# Patient Record
Sex: Female | Born: 1970
Health system: Southern US, Community
[De-identification: ages and names within clinical notes are randomized; demographics above are authoritative.]

## PROBLEM LIST (undated history)

## (undated) ENCOUNTER — Emergency Department (HOSPITAL_COMMUNITY): Admission: EM | Payer: Medicaid Other | Source: Home / Self Care

## (undated) DIAGNOSIS — L0291 Cutaneous abscess, unspecified: Secondary | ICD-10-CM

## (undated) HISTORY — PX: CHOLECYSTECTOMY: SHX55

## (undated) HISTORY — PX: APPENDECTOMY: SHX54

## (undated) HISTORY — PX: TONSILLECTOMY: SUR1361

---

## 1997-07-23 ENCOUNTER — Inpatient Hospital Stay (HOSPITAL_COMMUNITY): Admission: AD | Admit: 1997-07-23 | Discharge: 1997-07-23 | Payer: Self-pay | Admitting: Obstetrics & Gynecology

## 1997-10-03 ENCOUNTER — Ambulatory Visit (HOSPITAL_COMMUNITY): Admission: RE | Admit: 1997-10-03 | Discharge: 1997-10-03 | Payer: Self-pay | Admitting: Obstetrics

## 1997-11-01 ENCOUNTER — Ambulatory Visit (HOSPITAL_COMMUNITY): Admission: RE | Admit: 1997-11-01 | Discharge: 1997-11-01 | Payer: Self-pay | Admitting: Obstetrics

## 1997-12-29 ENCOUNTER — Inpatient Hospital Stay (HOSPITAL_COMMUNITY): Admission: AD | Admit: 1997-12-29 | Discharge: 1997-12-29 | Payer: Self-pay | Admitting: *Deleted

## 1998-01-02 ENCOUNTER — Encounter: Payer: Self-pay | Admitting: *Deleted

## 1998-01-02 ENCOUNTER — Inpatient Hospital Stay (HOSPITAL_COMMUNITY): Admission: AD | Admit: 1998-01-02 | Discharge: 1998-01-02 | Payer: Self-pay | Admitting: *Deleted

## 1998-01-03 ENCOUNTER — Ambulatory Visit (HOSPITAL_COMMUNITY): Admission: RE | Admit: 1998-01-03 | Discharge: 1998-01-03 | Payer: Self-pay | Admitting: *Deleted

## 1998-01-17 ENCOUNTER — Ambulatory Visit (HOSPITAL_COMMUNITY): Admission: RE | Admit: 1998-01-17 | Discharge: 1998-01-17 | Payer: Self-pay | Admitting: Obstetrics & Gynecology

## 1998-01-25 ENCOUNTER — Encounter: Admission: RE | Admit: 1998-01-25 | Discharge: 1998-01-25 | Payer: Self-pay | Admitting: Obstetrics

## 1998-01-25 ENCOUNTER — Ambulatory Visit (HOSPITAL_COMMUNITY): Admission: RE | Admit: 1998-01-25 | Discharge: 1998-01-25 | Payer: Self-pay | Admitting: *Deleted

## 1998-02-02 ENCOUNTER — Encounter (HOSPITAL_COMMUNITY): Admission: RE | Admit: 1998-02-02 | Discharge: 1998-03-21 | Payer: Self-pay | Admitting: *Deleted

## 1998-02-14 ENCOUNTER — Encounter: Admission: RE | Admit: 1998-02-14 | Discharge: 1998-02-14 | Payer: Self-pay | Admitting: Obstetrics & Gynecology

## 1998-02-20 ENCOUNTER — Ambulatory Visit (HOSPITAL_COMMUNITY): Admission: RE | Admit: 1998-02-20 | Discharge: 1998-02-20 | Payer: Self-pay | Admitting: Obstetrics

## 1998-02-21 ENCOUNTER — Encounter: Admission: RE | Admit: 1998-02-21 | Discharge: 1998-02-21 | Payer: Self-pay | Admitting: Obstetrics & Gynecology

## 1998-03-07 ENCOUNTER — Encounter: Admission: RE | Admit: 1998-03-07 | Discharge: 1998-03-07 | Payer: Self-pay | Admitting: Obstetrics & Gynecology

## 1998-03-13 ENCOUNTER — Encounter: Payer: Self-pay | Admitting: *Deleted

## 1998-03-13 ENCOUNTER — Inpatient Hospital Stay (HOSPITAL_COMMUNITY): Admission: AD | Admit: 1998-03-13 | Discharge: 1998-03-23 | Payer: Self-pay | Admitting: *Deleted

## 1998-03-20 ENCOUNTER — Encounter: Payer: Self-pay | Admitting: *Deleted

## 1998-03-30 ENCOUNTER — Inpatient Hospital Stay (HOSPITAL_COMMUNITY): Admission: AD | Admit: 1998-03-30 | Discharge: 1998-03-30 | Payer: Self-pay | Admitting: *Deleted

## 1998-04-08 ENCOUNTER — Inpatient Hospital Stay (HOSPITAL_COMMUNITY): Admission: AD | Admit: 1998-04-08 | Discharge: 1998-04-08 | Payer: Self-pay | Admitting: Obstetrics

## 1998-04-11 ENCOUNTER — Inpatient Hospital Stay (HOSPITAL_COMMUNITY): Admission: AD | Admit: 1998-04-11 | Discharge: 1998-04-11 | Payer: Self-pay | Admitting: Obstetrics

## 1999-10-20 ENCOUNTER — Emergency Department (HOSPITAL_COMMUNITY): Admission: EM | Admit: 1999-10-20 | Discharge: 1999-10-20 | Payer: Self-pay | Admitting: Emergency Medicine

## 1999-10-31 ENCOUNTER — Emergency Department (HOSPITAL_COMMUNITY): Admission: EM | Admit: 1999-10-31 | Discharge: 1999-10-31 | Payer: Self-pay | Admitting: Emergency Medicine

## 1999-12-24 ENCOUNTER — Emergency Department (HOSPITAL_COMMUNITY): Admission: EM | Admit: 1999-12-24 | Discharge: 1999-12-24 | Payer: Self-pay | Admitting: Emergency Medicine

## 2000-02-18 ENCOUNTER — Emergency Department (HOSPITAL_COMMUNITY): Admission: EM | Admit: 2000-02-18 | Discharge: 2000-02-18 | Payer: Self-pay | Admitting: Emergency Medicine

## 2000-02-21 ENCOUNTER — Encounter: Admission: RE | Admit: 2000-02-21 | Discharge: 2000-02-21 | Payer: Self-pay | Admitting: Hematology and Oncology

## 2000-02-24 ENCOUNTER — Encounter: Admission: RE | Admit: 2000-02-24 | Discharge: 2000-02-24 | Payer: Self-pay | Admitting: Internal Medicine

## 2001-06-17 ENCOUNTER — Emergency Department (HOSPITAL_COMMUNITY): Admission: EM | Admit: 2001-06-17 | Discharge: 2001-06-17 | Payer: Self-pay | Admitting: Emergency Medicine

## 2001-09-26 ENCOUNTER — Emergency Department (HOSPITAL_COMMUNITY): Admission: EM | Admit: 2001-09-26 | Discharge: 2001-09-26 | Payer: Self-pay | Admitting: Emergency Medicine

## 2001-12-13 ENCOUNTER — Emergency Department (HOSPITAL_COMMUNITY): Admission: EM | Admit: 2001-12-13 | Discharge: 2001-12-13 | Payer: Self-pay | Admitting: Emergency Medicine

## 2001-12-13 ENCOUNTER — Encounter: Payer: Self-pay | Admitting: Emergency Medicine

## 2001-12-25 ENCOUNTER — Inpatient Hospital Stay (HOSPITAL_COMMUNITY): Admission: AD | Admit: 2001-12-25 | Discharge: 2001-12-25 | Payer: Self-pay | Admitting: *Deleted

## 2002-01-26 ENCOUNTER — Emergency Department (HOSPITAL_COMMUNITY): Admission: EM | Admit: 2002-01-26 | Discharge: 2002-01-26 | Payer: Self-pay | Admitting: Emergency Medicine

## 2002-10-27 ENCOUNTER — Encounter: Payer: Self-pay | Admitting: Emergency Medicine

## 2002-10-27 ENCOUNTER — Emergency Department (HOSPITAL_COMMUNITY): Admission: EM | Admit: 2002-10-27 | Discharge: 2002-10-27 | Payer: Self-pay | Admitting: Emergency Medicine

## 2002-11-26 ENCOUNTER — Emergency Department (HOSPITAL_COMMUNITY): Admission: EM | Admit: 2002-11-26 | Discharge: 2002-11-26 | Payer: Self-pay | Admitting: Emergency Medicine

## 2003-04-01 ENCOUNTER — Emergency Department (HOSPITAL_COMMUNITY): Admission: EM | Admit: 2003-04-01 | Discharge: 2003-04-01 | Payer: Self-pay

## 2003-05-01 ENCOUNTER — Emergency Department (HOSPITAL_COMMUNITY): Admission: EM | Admit: 2003-05-01 | Discharge: 2003-05-01 | Payer: Self-pay | Admitting: Emergency Medicine

## 2003-05-06 ENCOUNTER — Emergency Department (HOSPITAL_COMMUNITY): Admission: EM | Admit: 2003-05-06 | Discharge: 2003-05-06 | Payer: Self-pay | Admitting: Family Medicine

## 2003-05-18 ENCOUNTER — Emergency Department (HOSPITAL_COMMUNITY): Admission: EM | Admit: 2003-05-18 | Discharge: 2003-05-18 | Payer: Self-pay | Admitting: Emergency Medicine

## 2003-05-18 ENCOUNTER — Emergency Department (HOSPITAL_COMMUNITY): Admission: EM | Admit: 2003-05-18 | Discharge: 2003-05-18 | Payer: Self-pay | Admitting: Family Medicine

## 2003-07-30 ENCOUNTER — Emergency Department (HOSPITAL_COMMUNITY): Admission: EM | Admit: 2003-07-30 | Discharge: 2003-07-30 | Payer: Self-pay | Admitting: Emergency Medicine

## 2003-10-04 ENCOUNTER — Emergency Department (HOSPITAL_COMMUNITY): Admission: EM | Admit: 2003-10-04 | Discharge: 2003-10-04 | Payer: Self-pay | Admitting: Emergency Medicine

## 2003-12-03 ENCOUNTER — Emergency Department (HOSPITAL_COMMUNITY): Admission: EM | Admit: 2003-12-03 | Discharge: 2003-12-03 | Payer: Self-pay | Admitting: Emergency Medicine

## 2003-12-25 ENCOUNTER — Emergency Department (HOSPITAL_COMMUNITY): Admission: EM | Admit: 2003-12-25 | Discharge: 2003-12-25 | Payer: Self-pay | Admitting: Emergency Medicine

## 2005-02-10 ENCOUNTER — Emergency Department (HOSPITAL_COMMUNITY): Admission: EM | Admit: 2005-02-10 | Discharge: 2005-02-10 | Payer: Self-pay | Admitting: Emergency Medicine

## 2005-06-29 ENCOUNTER — Emergency Department (HOSPITAL_COMMUNITY): Admission: EM | Admit: 2005-06-29 | Discharge: 2005-06-29 | Payer: Self-pay | Admitting: Emergency Medicine

## 2006-04-14 ENCOUNTER — Emergency Department (HOSPITAL_COMMUNITY): Admission: EM | Admit: 2006-04-14 | Discharge: 2006-04-15 | Payer: Self-pay | Admitting: Emergency Medicine

## 2006-04-14 ENCOUNTER — Emergency Department (HOSPITAL_COMMUNITY): Admission: EM | Admit: 2006-04-14 | Discharge: 2006-04-14 | Payer: Self-pay | Admitting: Family Medicine

## 2006-04-15 ENCOUNTER — Emergency Department (HOSPITAL_COMMUNITY): Admission: EM | Admit: 2006-04-15 | Discharge: 2006-04-15 | Payer: Self-pay | Admitting: Family Medicine

## 2006-04-15 ENCOUNTER — Inpatient Hospital Stay (HOSPITAL_COMMUNITY): Admission: EM | Admit: 2006-04-15 | Discharge: 2006-04-18 | Payer: Self-pay | Admitting: Emergency Medicine

## 2007-10-05 IMAGING — CT CT NECK W/ CM
3 series · 8 of 14 positions shown, 9 images · IV contrast (omnipaque)
Comparison: none

CLINICAL DATA: History of right neck swelling supraclavicular region.  Also fever.  
NECK CT WITH CONTRAST:
TECHNIQUE: Multidetector CT imaging of the neck was performed following the standard protocol during administration of intravenous contrast.
Contrast:  100 ml Omnipaque 300.

[Series 2: 2cc/(id)/1cc/(id) · axial · 0.47mm/px · z∈[-184,-105]mm · 2 of 65 slices shown, 3 images]
[im 22/65  soft-tissue]
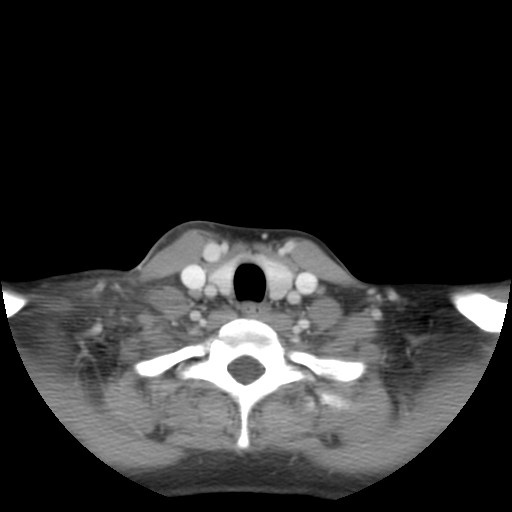
[im 22/65  bone]
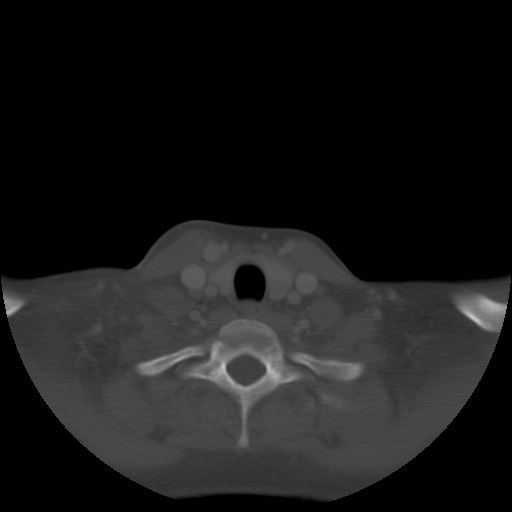
[im 43/65  bone]
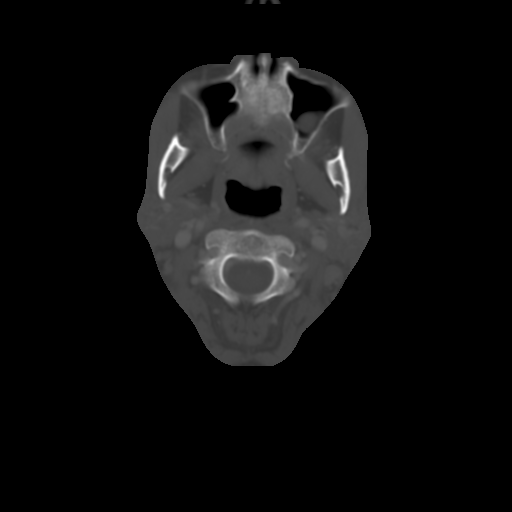

[Series 300: reformatted · sagittal · 0.48mm/px · 2 of 66 slices shown (1 of 2)]
[im 22/66  bone]
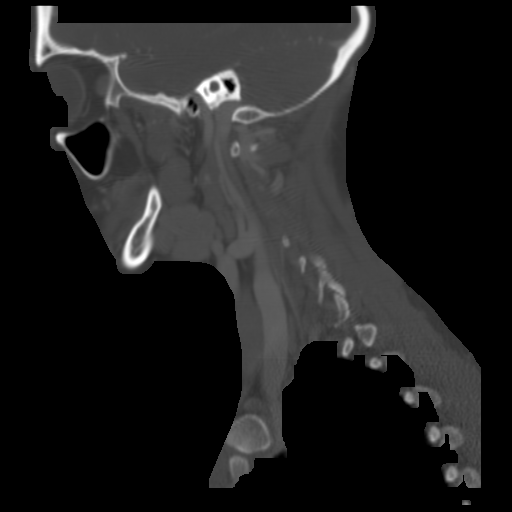
[im 44/66  bone]
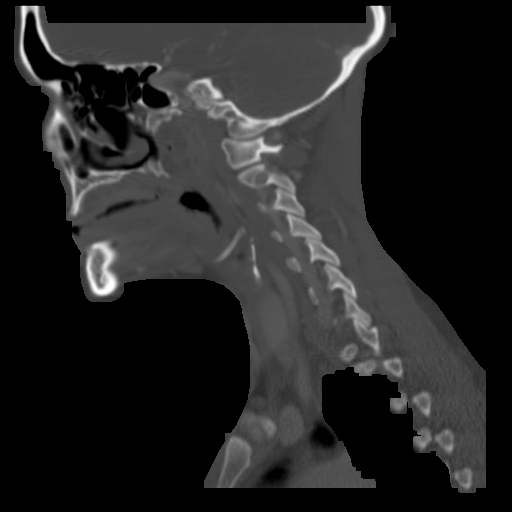

[Series 302: reformatted · coronal · 0.48mm/px · 4 of 92 slices shown (2 of 2)]
[im 19/92  bone]
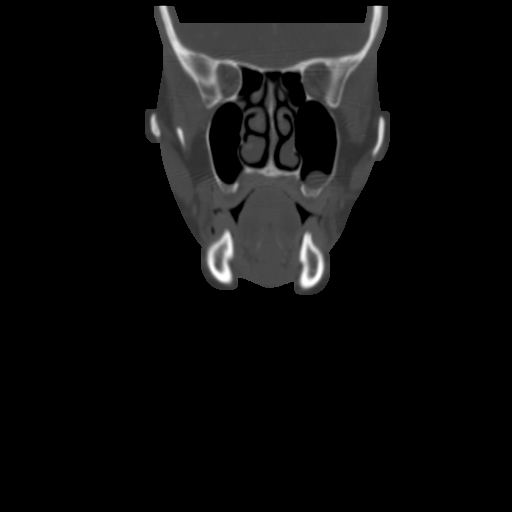
[im 37/92  bone]
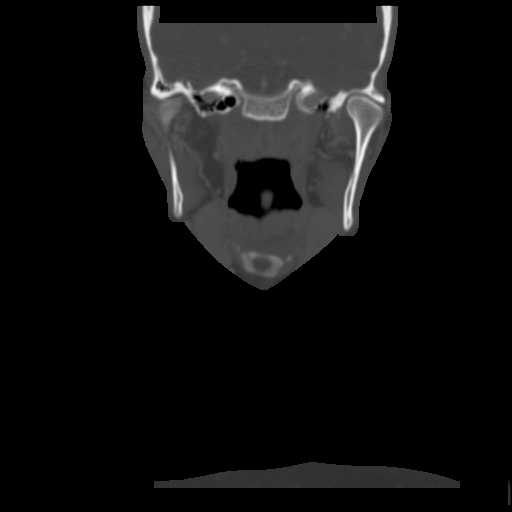
[im 55/92  bone]
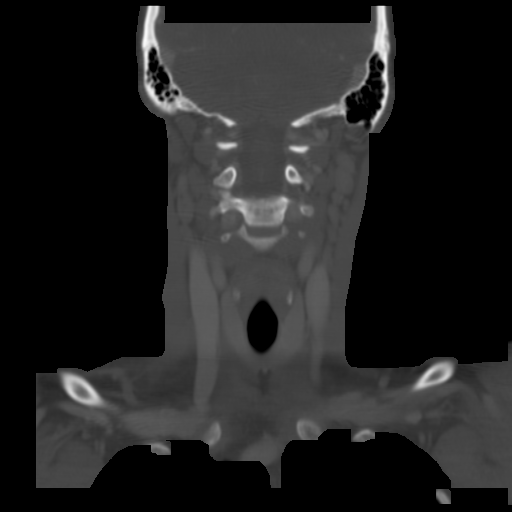
[im 73/92  bone]
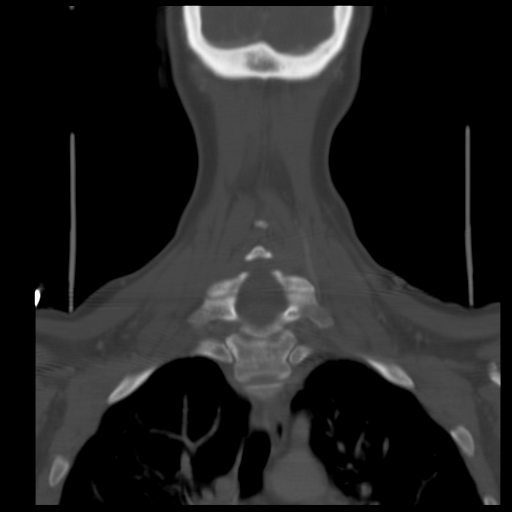

[8 of 14 positions shown; findings below may reference images not displayed]

FINDINGS: The visualized intracranial structures are unremarkable.  Paranasal sinuses are clear.  Airway widely patent.  No mucosal lesions identified.  Laryngeal structures unremarkable.  Thyroid gland and salivary glands normal.  
Major vascular structures widely patent.  Cervical spine unremarkable.  
No definite adenopathy is seen.  There is fullness in the right supraclavicular region with increased density of the normal hypoattenuating fat, suggesting inflammation.  There is no discrete mass, abscess, or area of coalescent lymph nodes.  The regional bones appear unremarkable.  There is no pulmonary process identified, and there is no mastoid pathology.
IMPRESSION: Ill-defined and relatively diffuse inflammatory change in the right supraclavicular fat, not associated with adenopathy, mastoid disease, pulmonary pathology, or abnormalities of the airway or esophagus.

## 2009-03-11 ENCOUNTER — Emergency Department (HOSPITAL_COMMUNITY): Admission: EM | Admit: 2009-03-11 | Discharge: 2009-03-11 | Payer: Self-pay | Admitting: Family Medicine

## 2010-01-09 ENCOUNTER — Ambulatory Visit (HOSPITAL_COMMUNITY): Admission: RE | Admit: 2010-01-09 | Payer: Self-pay | Source: Home / Self Care | Admitting: Family Medicine

## 2010-05-31 NOTE — Discharge Summary (Signed)
[name], [name] NO.:  [id number]   MEDICAL RECORD NO.:  [id number]          PATIENT TYPE:  INP   LOCATION:  5731                         FACILITY:  [facility]   PHYSICIAN:  Kinnie Scales. [name], M.D.DATE OF BIRTH:  [birth year]   DATE OF ADMISSION:  04/15/2006  DATE OF DISCHARGE:  04/18/2006                               DISCHARGE SUMMARY   ADMISSION DIAGNOSES:  1. Right cervical lymphadenopathy and cellulitis.  2. History of asthma.   DISCHARGE DIAGNOSES:  1. Right cervical lymphadenopathy and cellulitis.  2. History of asthma.   Patient was discharged to home in stable condition on April 18, 2006.   SURGERIES:  No surgeries this admission.   DIAGNOSTIC TESTING:  CT scan of the neck performed on April 15, 2006.  Patient is discharged to home in stable condition.  Normal diet, normal  activity.   MEDICATIONS:  Discharged on oral pain medication:  1. Percocet 5/325 1-2 p.o. q.4-6 hours p.r.n. pain.  2. Doxycycline 100 mg p.o. b.i.d. for 10 days.  3. Augmentin 500 mg p.o. b.i.d. for 10 days.   Patient is to scheduled to follow up at [facility] in approximately  2 weeks or sooner if warranted.   BRIEF HISTORY:  The patient is a 40 year old white female referred to  [facility] for evaluation of a progressive right cervical  lymphadenopathy, cellulitis and pain.  Patient developed symptoms of  acute upper respiratory tract infection approximately 2 weeks prior to  admission.  Has had a 2 day history of increasing right pain and  erythema with tenderness.  She was seen at the [facility] by Dr. Milus Glazier.  She was initially treated with  Rocephin and followed up the following day on April 15, 2006 with  increasing symptoms of neck pain and swelling.  Patient has past medical  history of asthma.  She is a one pack-per-day smoker.  On initial  evaluation was found to be afebrile with significant swelling and  tenderness in  the right neck.  Given her history and examination, I  recommended that we admit her to [facility] for intravenous  antibiotic therapy, a CT scan of the neck to rule out any abscess.  Patient understood and was transferred to [facility] for evaluation  and treatment.   HOSPITAL COURSE:  Patient was admitted in the ENT service on April 15, 2006.  She was started on intravenous vancomycin, on vancomycin  protocol.  She complained of severe neck pain. Initial blood work  included an elevated white blood cell count of 13.7 and low grade fever.  CT scan of the neck was obtained which showed cellulitic changes in the  soft tissue of the neck.  No evidence of significant adenopathy, mass,  tumor or cyst.  No abscess.  The patient was treated with intravenous  hydration, antibiotic therapy and pain management.  On the third  postoperative day, April 18, 2006, symptoms stabilized.  Patient was  afebrile.  She was tolerating normal oral diet without difficulty.  Pain  was well managed with  oral pain medication and her infection appeared to  be resolving. She was discharged home in stable condition. She had  normal bowel and bladder functions, ambulating well.  No problems or  complications.  She was discharged home with the above instructions and  scheduled to follow up at the [facility] in approximately 2 weeks  for further care or sooner as warranted.  The patient understood and  concurred with our discharge planning and will follow as above.           ______________________________  Kinnie Scales. [name], M.D.     DLS/MEDQ  D:  16/10/9602  T:  05/27/2006  Job:  [id number]   cc:   [facility]  Elvina Sidle, M.D.

## 2011-06-17 ENCOUNTER — Encounter (HOSPITAL_COMMUNITY): Payer: Self-pay | Admitting: Emergency Medicine

## 2011-06-17 ENCOUNTER — Emergency Department (HOSPITAL_COMMUNITY)
Admission: EM | Admit: 2011-06-17 | Discharge: 2011-06-17 | Disposition: A | Discharge status: Home / Self Care | Payer: Medicaid Other | Source: Home / Self Care | Attending: Emergency Medicine | Admitting: Emergency Medicine

## 2011-06-17 DIAGNOSIS — L0291 Cutaneous abscess, unspecified: Secondary | ICD-10-CM

## 2011-06-17 HISTORY — DX: Cutaneous abscess, unspecified: L02.91

## 2011-06-17 MED ORDER — ONDANSETRON 8 MG PO TBDP: 8.0000 mg | ORAL_TABLET | Freq: Three times a day (TID) | ORAL | Status: AC | PRN

## 2011-06-17 MED ORDER — OXYCODONE-ACETAMINOPHEN 5-325 MG PO TABS
ORAL_TABLET | ORAL | Status: AC
Start: 2011-06-17 — End: 2011-06-27

## 2011-06-17 MED ORDER — SULFAMETHOXAZOLE-TMP DS 800-160 MG PO TABS: 2.0000 | ORAL_TABLET | Freq: Two times a day (BID) | ORAL | Status: AC

## 2011-06-17 MED ORDER — MUPIROCIN 2 % EX OINT: TOPICAL_OINTMENT | Freq: Three times a day (TID) | CUTANEOUS | Status: AC

## 2011-06-17 NOTE — ED Notes (Signed)
Onset 2 days ago of pain on inside of leg, right leg.  Patient points to top of right inner thigh.

## 2011-06-17 NOTE — Discharge Instructions (Signed)

## 2011-06-17 NOTE — ED Provider Notes (Signed)
Chief Complaint  Patient presents with  . Leg Pain    History of Present Illness:   The patient is a 41 year old female who has had a 13 year history of recurring abscesses in her right groin area. The current abscess is been going on for about a day. It's not draining any pus. She denies any fever or chills. She has had abscesses elsewhere on her body. She does have a history of MRSA.  Review of Systems:  Other than noted above, the patient denies any of the following symptoms: Systemic:  No fever, chills or sweats. Skin:  No rash or itching.  PMFSH:  Past medical history, family history, social history, meds, and allergies were reviewed.  No history of diabetes or prior history of abscesses or MRSA.  Physical Exam:   Vital signs:  BP 122/76  Pulse 86  Temp(Src) 98.2 F (36.8 C) (Oral)  Resp 16  SpO2 96% Skin:  There is a 1 cm, fluctuant mass in the right groin, skin is otherwise normal.  No rash.  Procedure:  Verbal informed consent was obtained.  The patient was informed of the risks and benefits of the procedure and understands and accepts.  Identity of the patient was verified verbally and by wristband.   The abscess area described above was prepped with Betadine and alcohol and anesthetized with 2 mL of 2% Xylocaine with epinephrine.  Using a #11 scalpel blade, a singe straight incision was made into the area of fluctulence, yielding a small amount of prurulent drainage.  Routine cultures were obtained.  Blunt dissection was used to break up loculations and the resulting wound cavity was packed with 1/4 inch Iodoform gauze.  A sterile pressure dressing was applied.  Assessment:  The encounter diagnosis was Abscess.  Plan:   1.  The following meds were prescribed:   New Prescriptions   MUPIROCIN OINTMENT (BACTROBAN) 2 %    Apply topically 3 (three) times daily.   ONDANSETRON (ZOFRAN ODT) 8 MG DISINTEGRATING TABLET    Take 1 tablet (8 mg total) by mouth every 8 (eight) hours as  needed for nausea.   OXYCODONE-ACETAMINOPHEN (PERCOCET) 5-325 MG PER TABLET    1 to 2 tablets every 6 hours as needed for pain.   SULFAMETHOXAZOLE-TRIMETHOPRIM (BACTRIM DS) 800-160 MG PER TABLET    Take 2 tablets by mouth 2 (two) times daily.   2.  The patient was instructed in symptomatic care and handouts were given. 3.  The patient was instructed to leave the dressing in place and return again in 48 hours for packing removal.   Reuben Likes, MD 06/17/11 2216

## 2011-06-21 LAB — CULTURE, ROUTINE-ABSCESS

## 2011-06-22 NOTE — ED Notes (Signed)
Urine culture shows: MULTIPLE ORGANISMS PRESENT, NONE PREDOMINANT.  Pt adequately treated at visit with Bactrim and Bactroban.

## 2013-09-17 ENCOUNTER — Encounter (HOSPITAL_COMMUNITY): Payer: Self-pay | Admitting: Emergency Medicine

## 2013-09-17 ENCOUNTER — Emergency Department (HOSPITAL_COMMUNITY)
Admission: EM | Admit: 2013-09-17 | Discharge: 2013-09-17 | Disposition: A | Discharge status: Home / Self Care | Payer: Medicaid Other | Attending: Emergency Medicine | Admitting: Emergency Medicine

## 2013-09-17 DIAGNOSIS — Z8614 Personal history of Methicillin resistant Staphylococcus aureus infection: Secondary | ICD-10-CM | POA: Insufficient documentation

## 2013-09-17 DIAGNOSIS — L02415 Cutaneous abscess of right lower limb: Secondary | ICD-10-CM

## 2013-09-17 DIAGNOSIS — R110 Nausea: Secondary | ICD-10-CM | POA: Insufficient documentation

## 2013-09-17 DIAGNOSIS — F17200 Nicotine dependence, unspecified, uncomplicated: Secondary | ICD-10-CM | POA: Insufficient documentation

## 2013-09-17 DIAGNOSIS — J45909 Unspecified asthma, uncomplicated: Secondary | ICD-10-CM | POA: Insufficient documentation

## 2013-09-17 DIAGNOSIS — L03119 Cellulitis of unspecified part of limb: Principal | ICD-10-CM

## 2013-09-17 DIAGNOSIS — L02419 Cutaneous abscess of limb, unspecified: Secondary | ICD-10-CM | POA: Insufficient documentation

## 2013-09-17 MED ORDER — HYDROCODONE-ACETAMINOPHEN 5-325 MG PO TABS: 1.0000 | ORAL_TABLET | ORAL | Status: DC | PRN

## 2013-09-17 MED ORDER — SULFAMETHOXAZOLE-TRIMETHOPRIM 800-160 MG PO TABS: 1.0000 | ORAL_TABLET | Freq: Two times a day (BID) | ORAL | Status: DC

## 2013-09-17 MED ORDER — PROMETHAZINE HCL 25 MG PO TABS: 25.0000 mg | ORAL_TABLET | Freq: Four times a day (QID) | ORAL | Status: DC | PRN

## 2013-09-17 MED ORDER — CEPHALEXIN 500 MG PO CAPS
500.0000 mg | ORAL_CAPSULE | Freq: Four times a day (QID) | ORAL | Status: DC
Start: 2013-09-17 — End: 2014-02-08

## 2013-09-17 MED ORDER — ONDANSETRON 4 MG PO TBDP
8.0000 mg | ORAL_TABLET | Freq: Once | ORAL | Status: AC
  Administered 2013-09-17: 8 mg via ORAL
  Filled 2013-09-17: qty 2

## 2013-09-17 NOTE — ED Provider Notes (Signed)
CSN: [id number]     Arrival date & time 09/17/13  1924 History   None    This chart was scribed for non-physician practitioner, Dierdre Forth PA-C working with Layla Maw Ward, DO by Arlan Organ, ED Scribe. This patient was seen in room TR11C/TR11C and the patient's care was started at 9:16 PM.   Chief Complaint  Patient presents with  . Abcess    The history is provided by the patient. No language interpreter was used.    HPI Comments: [name] is a 43 y.o. female with a PMHx of asthma, recurrent boils and MRSA infection who presents to the Emergency Department complaining of an abscess to the R inner thigh x 2 days. Pt states area has progressively worsened since time of onset. She reports history of abscesses to same area previously. Last lancing treatment in 2011. She is unaware of a history of MRSA. No fever or chills. Pt with known allergy to Codeine. No other concerns this visit.   Past Medical History  Diagnosis Date  . Asthma   . Abscess    Past Surgical History  Procedure Laterality Date  . Appendectomy    . Cholecystectomy    . Tonsillectomy     History reviewed. No pertinent family history. History  Substance Use Topics  . Smoking status: Current Every Day Smoker  . Smokeless tobacco: Never Used  . Alcohol Use: No   OB History   Grav Para Term Preterm Abortions TAB SAB Ect Mult Living                 Review of Systems  Constitutional: Negative for fever and chills.  Gastrointestinal: Positive for nausea. Negative for vomiting.  Skin: Positive for rash and wound.  Allergic/Immunologic: Negative for immunocompromised state.  Hematological: Does not bruise/bleed easily.  Psychiatric/Behavioral: The patient is not nervous/anxious.       Allergies  Codeine  Home Medications   Prior to Admission medications   Medication Sig Start Date End Date Taking? Authorizing Provider  cephALEXin (KEFLEX) 500 MG capsule Take 1 capsule (500 mg total) by  mouth 4 (four) times daily. 09/17/13   Orlinda Slomski, PA-C  HYDROcodone-acetaminophen (NORCO/VICODIN) 5-325 MG per tablet Take 1 tablet by mouth every 4 (four) hours as needed for moderate pain or severe pain. 09/17/13   Breuna Loveall, PA-C  promethazine (PHENERGAN) 25 MG tablet Take 1 tablet (25 mg total) by mouth every 6 (six) hours as needed for nausea or vomiting. 09/17/13   Dahlia Client Aracelie Addis, PA-C  sulfamethoxazole-trimethoprim (SEPTRA DS) 800-160 MG per tablet Take 1 tablet by mouth every 12 (twelve) hours. 09/17/13   Aadvik Roker, PA-C   Triage Vitals: BP 149/72  Pulse 93  Temp(Src) 98.6 F (37 C) (Oral)  Resp 22  Ht  (1.549 m)  Wt 160 lb (72.576 kg)  BMI 30.25 kg/m2  SpO2 99%  LMP 08/15/2013   Physical Exam  Nursing note and vitals reviewed. Constitutional: She is oriented to person, place, and time. She appears well-developed and well-nourished. No distress.  HENT:  Head: Normocephalic and atraumatic.  Eyes: Conjunctivae are normal. No scleral icterus.  Neck: Normal range of motion.  Cardiovascular: Normal rate, regular rhythm, normal heart sounds and intact distal pulses.   No murmur heard. Pulmonary/Chest: Effort normal and breath sounds normal. No respiratory distress.  Abdominal: Soft. She exhibits no distension. There is no tenderness.  Lymphadenopathy:    She has no cervical adenopathy.  Neurological: She is alert and oriented  to person, place, and time.  Skin: Skin is warm and dry. She is not diaphoretic. There is erythema.  4x5cm area of erythema with 1x1cm area of induration  Psychiatric: She has a normal mood and affect.    ED Course  Procedures (including critical care time)  DIAGNOSTIC STUDIES: Oxygen Saturation is 99% on RA, Normal by my interpretation.    COORDINATION OF CARE: 9:16 PM- Will perform ultrasound and I&D. Discussed treatment plan with pt at bedside and pt agreed to plan.     EMERGENCY DEPARTMENT US SOFT TISSUE  INTERPRETATION "Study: Limited Ultrasound of the noted body part in comments below"  INDICATIONS: Pain Multiple views of the body part are obtained with a multi-frequency linear probe  PERFORMED BY:  Myself  IMAGES ARCHIVED?: Yes  SIDE:Right   BODY PART:Other soft tisse (comment in note) R inner thigh  FINDINGS: Small abcess present  LIMITATIONS:  Body Habitus  INTERPRETATION:  Abcess present and Cellulitis present  COMMENT:  Small abscess noted    INCISION AND DRAINAGE PROCEDURE NOTE: Patient identification was confirmed and verbal consent was obtained. This procedure was performed by Dahlia Client Katrina Daddona PA-C  at 9:16 PM. Site: R inner thigh Sterile procedures observed Anesthetic used (type and amt): 4 CC of 2% lidocaine with epinephrine  Drainage: Minimal Complexity: Complex Packing used: None Site anesthetized, incision made over site, wound drained and explored loculations, rinsed with copious amounts of normal saline, wound packed with sterile gauze, covered with dry, sterile dressing.  Pt tolerated procedure well without complications.  Instructions for care discussed verbally and pt provided with additional written instructions for homecare and f/u.   Labs Review Labs Reviewed - No data to display  Imaging Review No results found.   EKG Interpretation None      MDM   Final diagnoses:  Abscess of right leg excluding foot   [name] presents with abscess to the right inner thigh.  Hx of recurrent abscesses and MRSA infections.  Patient with skin abscess amenable to incision and drainage.  Abscess was not large enough to warrant packing or drain,  wound recheck in 2 days. Encouraged home warm soaks and flushing.  Mild signs of cellulitis is surrounding skin.  Will d/c to home.  Abx as pt has HX of MRSA.    BP 149/72  Pulse 93  Temp(Src) 98.6 F (37 C) (Oral)  Resp 22  Ht  (1.549 m)  Wt 160 lb (72.576 kg)  BMI 30.25 kg/m2  SpO2 99%  LMP  08/15/2013   I personally performed the services described in this documentation, which was scribed in my presence. The recorded information has been reviewed and is accurate.    Dahlia Client Laurelyn Terrero, PA-C 09/17/13 2119

## 2013-09-17 NOTE — ED Provider Notes (Signed)
Medical screening examination/treatment/procedure(s) were performed by non-physician practitioner and as supervising physician I was immediately available for consultation/collaboration.   EKG Interpretation None        Layla Maw Isabel Freese, DO 09/17/13 2316

## 2013-09-17 NOTE — Discharge Instructions (Signed)
1. Medications: vicodin, phenergan, keflex, bactrim, usual home medications 2. Treatment: rest, drink plenty of fluids, warm soaks 3. Follow Up: Please followup with your primary doctor for wound check in 2 days; if you do not have a primary care doctor use the resource guide provided to find one;      Abscess An abscess is an infected area that contains a collection of pus and debris.It can occur in almost any part of the body. An abscess is also known as a furuncle or boil. CAUSES  An abscess occurs when tissue gets infected. This can occur from blockage of oil or sweat glands, infection of hair follicles, or a minor injury to the skin. As the body tries to fight the infection, pus collects in the area and creates pressure under the skin. This pressure causes pain. People with weakened immune systems have difficulty fighting infections and get certain abscesses more often.  SYMPTOMS Usually an abscess develops on the skin and becomes a painful mass that is red, warm, and tender. If the abscess forms under the skin, you may feel a moveable soft area under the skin. Some abscesses break open (rupture) on their own, but most will continue to get worse without care. The infection can spread deeper into the body and eventually into the bloodstream, causing you to feel ill.  DIAGNOSIS  Your caregiver will take your medical history and perform a physical exam. A sample of fluid may also be taken from the abscess to determine what is causing your infection. TREATMENT  Your caregiver may prescribe antibiotic medicines to fight the infection. However, taking antibiotics alone usually does not cure an abscess. Your caregiver may need to make a small cut (incision) in the abscess to drain the pus. In some cases, gauze is packed into the abscess to reduce pain and to continue draining the area. HOME CARE INSTRUCTIONS   Only take over-the-counter or prescription medicines for pain, discomfort, or fever as  directed by your caregiver.  If you were prescribed antibiotics, take them as directed. Finish them even if you start to feel better.  If gauze is used, follow your caregiver's directions for changing the gauze.  To avoid spreading the infection:  Keep your draining abscess covered with a bandage.  Wash your hands well.  Do not share personal care items, towels, or whirlpools with others.  Avoid skin contact with others.  Keep your skin and clothes clean around the abscess.  Keep all follow-up appointments as directed by your caregiver. SEEK MEDICAL CARE IF:   You have increased pain, swelling, redness, fluid drainage, or bleeding.  You have muscle aches, chills, or a general ill feeling.  You have a fever. MAKE SURE YOU:   Understand these instructions.  Will watch your condition.  Will get help right away if you are not doing well or get worse. Document Released: 10/09/2004 Document Revised: 07/01/2011 Document Reviewed: 03/14/2011 [facility] Patient Information 2015 [place], [place]. This information is not intended to replace advice given to you by your health care provider. Make sure you discuss any questions you have with your health care provider.    Emergency Department Resource Guide 1) Find a Doctor and Pay Out of Pocket Although you won't have to find out who is covered by your insurance plan, it is a good idea to ask around and get recommendations. You will then need to call the office and see if the doctor you have chosen will accept you as a new patient and  what types of options they offer for patients who are self-pay. Some doctors offer discounts or will set up payment plans for their patients who do not have insurance, but you will need to ask so you aren't surprised when you get to your appointment.  2) Contact Your Local Health Department Not all health departments have doctors that can see patients for sick visits, but many do, so it is worth a call to see  if yours does. If you don't know where your local health department is, you can check in your phone book. The CDC also has a tool to help you locate your state's health department, and many state websites also have listings of all of their local health departments.  3) Find a Walk-in Clinic If your illness is not likely to be very severe or complicated, you may want to try a walk in clinic. These are popping up all over the country in pharmacies, drugstores, and shopping centers. They're usually staffed by nurse practitioners or physician assistants that have been trained to treat common illnesses and complaints. They're usually fairly quick and inexpensive. However, if you have serious medical issues or chronic medical problems, these are probably not your best option.  No Primary Care Doctor: - Call Health Connect at  [phone] - they can help you locate a primary care doctor that  accepts your insurance, provides certain services, etc. - Physician Referral Service- [phone]  Chronic Pain Problems: Organization         Address  Phone   Notes  [facility]  [phone] Patients need to be referred by their primary care doctor.   Medication Assistance: Organization         Address  Phone   Notes  [facility] Medication [facility] [address] [place], [place] [address] [phone] --Must be a resident of [facility] -- Must have NO insurance coverage whatsoever (no Medicaid/ Medicare, etc.) -- The pt. MUST have a primary care doctor that directs their care regularly and follows them in the community   [facility]  [phone]   [employer]  [phone]    Agencies that provide inexpensive medical care: Organization         Address  Phone   Notes  [facility]  [phone]   [facility]    [phone]   [facility] [address] [place], [place] [address] [phone]   [facility] [address] [place], [place] [phone]   Planned Parenthood    [phone]   [facility]    [phone]   [facility]  [address], [place] Phone:  [phone], Fax:  [phone] Hours of Operation:  9 am - 6 pm, M-F.  Also accepts Medicaid/Medicare and self-pay.  [facility]  [address], [place] Phone: [phone], Fax: [phone]. Hours of Operation:  8:30 am - 5:30 pm, M-F.  Also accepts Medicaid and self-pay.  [facility] [place] [address], [place] Phone: [phone]   [facility] [address] Natasha Bence [place], [place] [phone], Ext. [phone] Mondays & Thursdays: 7-9 AM.  First 15 patients are seen on a first come, first serve basis.    Medicaid-accepting [facility] Providers:  Retail buyer  Notes  [facility] [address], [place] [phone] Also accepts self-pay patients.  [facility] [address], [place]  [phone]   [place], [address], [place] [phone]   [facility] [address], [place] [phone]   Lucianne Lei [address], [place]   [phone] Only accepts [place] Access [place] patients after they have their name applied to their card.   Self-Pay (no insurance) in [facility]:  Organization         Address  Phone   Notes  Sickle Cell Patients, [facility] [place] [phone]   [facility] [place] [phone]   [facility] [place]  [place], [place], [place] [phone]   [facility]/Dr. Osei-Bonsu  [address] [place] or [address], [place] [phone] Phone number for both [place] and [place] locations is the same.  Urgent Medical and [facility] [address], [place] [phone]   [facility] [address] [place] or [address] [phone]   [facility] [address], [place] [phone], phone; [phone], fax Sees patients 1st and 3rd Saturday of every month.  Must not qualify for public or private insurance (i.e. Medicaid, Medicare, [place] Health Choice, Veterans' Benefits)  Household income should be no more than 200% of the poverty level The clinic cannot treat you if you are pregnant or think you are pregnant  Sexually transmitted diseases are not treated at the clinic.    Dental Care: Organization         Address  Phone  Notes  [facility] [place] [phone] Accepts children up to age 77 who are enrolled in [place] or [place]; pregnant women with a Medicaid card; and children who have applied for Medicaid or [place] Health Choice, but were declined, whose parents can pay a reduced fee at time of service.  [facility] Department of [facility]  [address], [place] [phone] Accepts children up to age 71 who are enrolled in [place] or [place]; pregnant women with a Medicaid card; and children who have applied for Medicaid or [place] Health Choice, but were declined, whose parents can pay a reduced fee at time of service.  [facility] Access PRO[facility]  [place] [phone] Patients are seen by appointment only. Walk-ins are not accepted. [place] will see patients 65 years of age and older. Monday - Tuesday (8am-5pm) Most Wednesdays (8:30-5pm) $30 per visit, cash only  [facility] Adult Dental Access PROGRAM  [address], [facility] [phone] Patients are  seen by appointment only. Walk-ins are not accepted. [place] will see patients 80 years of age and older. One Wednesday Evening (Monthly: Volunteer Based).  $30 per visit, cash only  [place]  [phone] for adults; Children under age 54, call Graduate Pediatric Dentistry at [phone]. Children aged 68-14, please call [phone] to request a pediatric application.  Dental services are provided in all areas of dental care including fillings, crowns and bridges, complete  and partial dentures, implants, gum treatment, root canals, and extractions. Preventive care is also provided. Treatment is provided to both adults and children. Patients are selected via a lottery and there is often a waiting list.   [facility] [address] [place]  [phone] [email or web address]   [facility] [address] [place], [place] [phone], Ext. [phone] Second and Fourth Thursday of each month, opens at 6:30 AM; Clinic ends at 9 AM.  Patients are seen on a first-come first-served basis, and a limited number are seen during each clinic.   [facility]  [address] Hillard Danker [place], [place] [phone]   Eligibility Requirements You must have lived in [place], [place], or [place] counties for at least the last three months.   You cannot be eligible for state or federal sponsored [employer], including [employer], [place], or [employer].   You generally cannot be eligible for healthcare insurance through your employer.    How to apply: Eligibility screenings are held every Tuesday and Wednesday afternoon from 1:00 pm until 4:00 pm. You do not need an appointment for the interview!  [facility] [address] [place], [place]   [place]  [place] Department  [place]  [phone]     Behavioral Health Resources in the Community: Intensive Outpatient Programs Organization         Address  Phone  Notes  [place]. [address] [place], [place] [phone]   [facility] Outpatient [address], [place], [place]   [facility] [address], [place], [place]   [place] [address]  [place], [place] or [phone]   Substance Abuse Resources Organization         Address  Phone  Notes  Alcohol [facility] Drug Services  [phone]   [place]  [phone]   [facility] [place]   Chinita Pester  [phone]   Residential & Outpatient Substance Abuse Program  [phone]   Psychological Services Organization         Address  Phone  Notes  [facility] [place]  [place]  [phone]   [place] [address], [place] or [phone]    Mobile Crisis Teams Organization         Address  Phone  Notes  [facility], Mobile Crisis Care Unit  [phone]   [facility]  [address] [place], [place]   [place] [address] [place] [phone]    Self-Help/Support Groups Organization         Address  Phone             Notes  [place]. of [place] - variety of support groups  [place] Call for more information  Narcotics Anonymous (NA), [facility] [address], [employer] [place]  2 meetings at this location   Special educational needs teacher         Address  Phone  Notes  [facility] [place],    [place]  [place]  [address] [place] [address]476, [place], [place]   [place], [place] [phone] Admissions: 8am-3pm M-F  Incentives  [facility]stance Abuse Treatment Center [address]    [place], [place] [phone]   [facility] [address] [place], [place], [place] [phone]   [facility] [address],  [place], [place] [phone]   [facility] - Intensive Outpatient [address] Laurell Joseph[address], [place], [place] [phone]   [facility] (Addiction Recovery Care [facility] [address] [place]  [place], [place] [phone] or [phone]   [facility] ([facility]) [address] [place], [place] [phone] Accepts Medicaid  [facility] [place] [address]  [place] [phone] Substance Abuse/Addiction Treatment   [facility] Organization         Address  Phone  Notes  [facility]  [phone]   Angie Fava, PhD [address] Ervin Knack [place], [place]   [phone] or [phone]   [facility] Behavioral   [address] [place], [place] [phone]   [facility] [address], [place], [place] [phone] Insurance/Medicaid/sponsorship through [facility] and Families [address]                                    [place], [place] [phone] Therapy/tele-psych/case  [facility] [address], [place] [phone]    Dr. Lolly Mustache  [phone]   [facility]  [employer] [facility] Dept. 1) [address], [place] 2) [address], [place] 3)  [address], [place] [phone]  [phone]   [facility] Child Abuse Hotline 985-643-2336 or [phone] (After Hours)

## 2013-09-17 NOTE — ED Notes (Signed)
Patient presents with an abcess to the right inner thigh.  States she has had others at the same place.

## 2014-02-08 ENCOUNTER — Emergency Department (HOSPITAL_COMMUNITY)
Admission: EM | Admit: 2014-02-08 | Discharge: 2014-02-08 | Disposition: A | Discharge status: Home / Self Care | Payer: Medicaid Other | Source: Home / Self Care | Attending: Family Medicine | Admitting: Family Medicine

## 2014-02-08 ENCOUNTER — Encounter (HOSPITAL_COMMUNITY): Payer: Self-pay | Admitting: Emergency Medicine

## 2014-02-08 DIAGNOSIS — J40 Bronchitis, not specified as acute or chronic: Secondary | ICD-10-CM

## 2014-02-08 MED ORDER — AZITHROMYCIN 250 MG PO TABS: 250.0000 mg | ORAL_TABLET | Freq: Every day | ORAL | Status: DC

## 2014-02-08 MED ORDER — ALBUTEROL SULFATE HFA 108 (90 BASE) MCG/ACT IN AERS: 2.0000 | INHALATION_SPRAY | Freq: Four times a day (QID) | RESPIRATORY_TRACT | Status: AC | PRN

## 2014-02-08 MED ORDER — IPRATROPIUM-ALBUTEROL 0.5-2.5 (3) MG/3ML IN SOLN
3.0000 mL | Freq: Once | RESPIRATORY_TRACT | Status: AC
  Administered 2014-02-08: 3 mL via RESPIRATORY_TRACT

## 2014-02-08 MED ORDER — PREDNISONE 50 MG PO TABS: 50.0000 mg | ORAL_TABLET | Freq: Every day | ORAL | Status: DC

## 2014-02-08 MED ORDER — IPRATROPIUM-ALBUTEROL 0.5-2.5 (3) MG/3ML IN SOLN
RESPIRATORY_TRACT | Status: AC
  Filled 2014-02-08: qty 3

## 2014-02-08 NOTE — ED Provider Notes (Addendum)
[name] is a 44 y.o. female who presents to Urgent Care today for cough wheezing and shortness of breath. Symptoms present for a few weeks and are consistent with previous episodes of bronchitis. Patient is a daily smoker and gets episodes of bronchitis several times per year. No vomiting diarrhea chest pain or severe shortness of breath. Patient wishes to quit smoking.   Past Medical History  Diagnosis Date  . Asthma   . Abscess    Past Surgical History  Procedure Laterality Date  . Appendectomy    . Cholecystectomy    . Tonsillectomy     History  Substance Use Topics  . Smoking status: Current Every Day Smoker  . Smokeless tobacco: Never Used  . Alcohol Use: No   ROS as above Medications: No current facility-administered medications for this encounter.   Current Outpatient Prescriptions  Medication Sig Dispense Refill  . albuterol (PROVENTIL HFA;VENTOLIN HFA) 108 (90 BASE) MCG/ACT inhaler Inhale 2 puffs into the lungs every 6 (six) hours as needed for wheezing or shortness of breath. 1 Inhaler 2  . azithromycin (ZITHROMAX) 250 MG tablet Take 1 tablet (250 mg total) by mouth daily. Take first 2 tablets together, then 1 every day until finished. 6 tablet 0  . predniSONE (DELTASONE) 50 MG tablet Take 1 tablet (50 mg total) by mouth daily. 5 tablet 0  . [DISCONTINUED] promethazine (PHENERGAN) 25 MG tablet Take 1 tablet (25 mg total) by mouth every 6 (six) hours as needed for nausea or vomiting. 12 tablet 0   Allergies  Allergen Reactions  . Codeine Itching and Nausea And Vomiting     Exam:  BP 149/93 mmHg  Pulse 92  Temp(Src) 98 F (36.7 C) (Oral)  Resp 16  SpO2 95% Gen: Well NAD HEENT: EOMI,  MMM normal tympanic membranes and posterior pharynx Lungs: Normal work of breathing. Slight wheezing bilaterally Heart: RRR no MRG Abd: NABS, Soft. Nondistended, Nontender Exts: Brisk capillary refill, warm and well perfused.   Patient was given a 2.5/0.5 mg DuoNeb  nebulizer treatment and felt better  No results found for this or any previous visit (from the past 24 hour(s)). No results found.  Assessment and Plan: 44 y.o. female with bronchitis possible COPD. Treat with albuterol prednisone and azithromycin. Return as needed. Encourage smoking cessation Discussed warning signs or symptoms. Please see discharge instructions. Patient expresses understanding.     Rodolph BongEvan S Yenty Bloch, MD 02/08/14 1317  Rodolph BongEvan S Obadiah Dennard, MD 02/08/14 [phone]

## 2014-02-08 NOTE — Discharge Instructions (Signed)
Thank you for coming in today. Call or go to the emergency room if you get worse, have trouble breathing, have chest pains, or palpitations.   Set a quit date. You can use 21 mg patches daily with 0.5 mg nicotine gum Please quit smoking  Acute Bronchitis Bronchitis is inflammation of the airways that extend from the windpipe into the lungs (bronchi). The inflammation often causes mucus to develop. This leads to a cough, which is the most common symptom of bronchitis.  In acute bronchitis, the condition usually develops suddenly and goes away over time, usually in a couple weeks. Smoking, allergies, and asthma can make bronchitis worse. Repeated episodes of bronchitis may cause further lung problems.  CAUSES Acute bronchitis is most often caused by the same virus that causes a cold. The virus can spread from person to person (contagious) through coughing, sneezing, and touching contaminated objects. SIGNS AND SYMPTOMS   Cough.   Fever.   Coughing up mucus.   Body aches.   Chest congestion.   Chills.   Shortness of breath.   Sore throat.  DIAGNOSIS  Acute bronchitis is usually diagnosed through a physical exam. Your health care provider will also ask you questions about your medical history. Tests, such as chest X-rays, are sometimes done to rule out other conditions.  TREATMENT  Acute bronchitis usually goes away in a couple weeks. Oftentimes, no medical treatment is necessary. Medicines are sometimes given for relief of fever or cough. Antibiotic medicines are usually not needed but may be prescribed in certain situations. In some cases, an inhaler may be recommended to help reduce shortness of breath and control the cough. A cool mist vaporizer may also be used to help thin bronchial secretions and make it easier to clear the chest.  HOME CARE INSTRUCTIONS  Get plenty of rest.   Drink enough fluids to keep your urine clear or pale yellow (unless you have a medical  condition that requires fluid restriction). Increasing fluids may help thin your respiratory secretions (sputum) and reduce chest congestion, and it will prevent dehydration.   Take medicines only as directed by your health care provider.  If you were prescribed an antibiotic medicine, finish it all even if you start to feel better.  Avoid smoking and secondhand smoke. Exposure to cigarette smoke or irritating chemicals will make bronchitis worse. If you are a smoker, consider using nicotine gum or skin patches to help control withdrawal symptoms. Quitting smoking will help your lungs heal faster.   Reduce the chances of another bout of acute bronchitis by washing your hands frequently, avoiding people with cold symptoms, and trying not to touch your hands to your mouth, nose, or eyes.   Keep all follow-up visits as directed by your health care provider.  SEEK MEDICAL CARE IF: Your symptoms do not improve after 1 week of treatment.  SEEK IMMEDIATE MEDICAL CARE IF:  You develop an increased fever or chills.   You have chest pain.   You have severe shortness of breath.  You have bloody sputum.   You develop dehydration.  You faint or repeatedly feel like you are going to pass out.  You develop repeated vomiting.  You develop a severe headache. MAKE SURE YOU:   Understand these instructions.  Will watch your condition.  Will get help right away if you are not doing well or get worse. Document Released: 02/07/2004 Document Revised: 05/16/2013 Document Reviewed: 06/22/2012 [facility] Patient Information 2015 [place]. This information is not intended to  replace advice given to you by your health care provider. Make sure you discuss any questions you have with your health care provider. ° °

## 2014-02-08 NOTE — ED Notes (Signed)
C/o cold sx onset 2 weeks Sx include: prod cough, congestion, chills, wheezing Smokes 1 PPD; hx of asthma Denies fevers Taking OTC cold meds w/no relief Alert, no signs of acute distress.

## 2014-07-31 ENCOUNTER — Emergency Department (HOSPITAL_COMMUNITY)
Admission: EM | Admit: 2014-07-31 | Discharge: 2014-08-01 | Disposition: A | Discharge status: Home / Self Care | Payer: Medicaid Other | Attending: Emergency Medicine | Admitting: Emergency Medicine

## 2014-07-31 ENCOUNTER — Encounter (HOSPITAL_COMMUNITY): Payer: Self-pay | Admitting: *Deleted

## 2014-07-31 DIAGNOSIS — Z79899 Other long term (current) drug therapy: Secondary | ICD-10-CM | POA: Diagnosis not present

## 2014-07-31 DIAGNOSIS — Z872 Personal history of diseases of the skin and subcutaneous tissue: Secondary | ICD-10-CM | POA: Diagnosis not present

## 2014-07-31 DIAGNOSIS — T63061A Toxic effect of venom of other North and South American snake, accidental (unintentional), initial encounter: Secondary | ICD-10-CM | POA: Diagnosis present

## 2014-07-31 DIAGNOSIS — T63004A Toxic effect of unspecified snake venom, undetermined, initial encounter: Secondary | ICD-10-CM

## 2014-07-31 DIAGNOSIS — Y999 Unspecified external cause status: Secondary | ICD-10-CM | POA: Insufficient documentation

## 2014-07-31 DIAGNOSIS — Y939 Activity, unspecified: Secondary | ICD-10-CM | POA: Insufficient documentation

## 2014-07-31 DIAGNOSIS — Z7952 Long term (current) use of systemic steroids: Secondary | ICD-10-CM | POA: Insufficient documentation

## 2014-07-31 DIAGNOSIS — Y9289 Other specified places as the place of occurrence of the external cause: Secondary | ICD-10-CM | POA: Diagnosis not present

## 2014-07-31 DIAGNOSIS — Z720 Tobacco use: Secondary | ICD-10-CM | POA: Diagnosis not present

## 2014-07-31 DIAGNOSIS — J45909 Unspecified asthma, uncomplicated: Secondary | ICD-10-CM | POA: Diagnosis not present

## 2014-07-31 LAB — CBC WITH DIFFERENTIAL/PLATELET
BASOS PCT: 1 % (ref 0–1)
Basophils Absolute: 0.1 10*3/uL (ref 0.0–0.1)
Eosinophils Absolute: 0.1 10*3/uL (ref 0.0–0.7)
Eosinophils Relative: 1 % (ref 0–5)
HEMATOCRIT: 34.7 % — AB (ref 36.0–46.0)
HEMOGLOBIN: 10.5 g/dL — AB (ref 12.0–15.0)
LYMPHS ABS: 3.5 10*3/uL (ref 0.7–4.0)
LYMPHS PCT: 28 % (ref 12–46)
MCH: 24.1 pg — ABNORMAL LOW (ref 26.0–34.0)
MCHC: 30.3 g/dL (ref 30.0–36.0)
MCV: 79.6 fL (ref 78.0–100.0)
Monocytes Absolute: 0.7 10*3/uL (ref 0.1–1.0)
Monocytes Relative: 6 % (ref 3–12)
NEUTROS PCT: 64 % (ref 43–77)
Neutro Abs: 7.8 10*3/uL — ABNORMAL HIGH (ref 1.7–7.7)
PLATELETS: 456 10*3/uL — AB (ref 150–400)
RBC: 4.36 MIL/uL (ref 3.87–5.11)
RDW: 16.7 % — ABNORMAL HIGH (ref 11.5–15.5)
WBC: 12.2 10*3/uL — ABNORMAL HIGH (ref 4.0–10.5)

## 2014-07-31 LAB — COMPREHENSIVE METABOLIC PANEL
ALBUMIN: 4.1 g/dL (ref 3.5–5.0)
ALT: 23 U/L (ref 14–54)
ANION GAP: 6 (ref 5–15)
AST: 20 U/L (ref 15–41)
Alkaline Phosphatase: 75 U/L (ref 38–126)
BILIRUBIN TOTAL: 0.5 mg/dL (ref 0.3–1.2)
BUN: 11 mg/dL (ref 6–20)
CALCIUM: 9 mg/dL (ref 8.9–10.3)
CHLORIDE: 104 mmol/L (ref 101–111)
CO2: 27 mmol/L (ref 22–32)
Creatinine, Ser: 0.9 mg/dL (ref 0.44–1.00)
GFR calc Af Amer: >60 mL/min (ref >60)
GLUCOSE: 114 mg/dL — AB (ref 65–99)
Potassium: 3.4 mmol/L — ABNORMAL LOW (ref 3.5–5.1)
Sodium: 137 mmol/L (ref 135–145)
Total Protein: 7.8 g/dL (ref 6.5–8.1)

## 2014-07-31 LAB — FIBRINOGEN: Fibrinogen: 362 mg/dL (ref 204–475)

## 2014-07-31 LAB — PROTIME-INR
INR: 1.04 (ref 0.00–1.49)
Prothrombin Time: 13.8 seconds (ref 11.6–15.2)

## 2014-07-31 MED ORDER — TETANUS-DIPHTH-ACELL PERTUSSIS 5-2.5-18.5 LF-MCG/0.5 IM SUSP
0.5000 mL | Freq: Once | INTRAMUSCULAR | Status: AC
  Administered 2014-07-31: 0.5 mL via INTRAMUSCULAR
  Filled 2014-07-31: qty 0.5

## 2014-07-31 MED ORDER — FENTANYL CITRATE (PF) 100 MCG/2ML IJ SOLN
50.0000 ug | Freq: Once | INTRAMUSCULAR | Status: AC
  Administered 2014-07-31: 50 ug via INTRAVENOUS
  Filled 2014-07-31: qty 2

## 2014-07-31 NOTE — ED Notes (Signed)
Per EMS, pt from home, pt reports copper head bite to R foot about an hour ago.  Swelling noted to R foot.  One puncture mark noted to medial R foot.

## 2014-07-31 NOTE — ED Provider Notes (Signed)
CSN: [id number]     Arrival date & time 07/31/14  2218 History   First MD Initiated Contact with Patient 07/31/14 2224     Chief Complaint  Patient presents with  . Snake Bite     (Consider location/radiation/quality/duration/timing/severity/associated sxs/prior Treatment) The history is provided by the patient and medical records.    This is a 44 year old female with history of asthma, presenting to the ED following a snake bite. [place] went outside towards her fire where her daughter and friend were making smores because the dog was barking uncontrollably.  Patient states she was wearing socks and blood drops when she felt a bite onto her right great toe. She states she went back into the house and got her husband, who went outside to try to find a snake. Patient elevated her foot and took extra strength Tylenol prior to calling EMS. Husband did locate a snake that was dead near the dog pen, unsure if this is the same snake that bit her. Snake that was brought in, does appear to be a copperhead.  Patient has pain localized to right great toe and some mild swelling of right dorsal foot has been marked by EMS. She was given fentanyl and route with significant improvement of her pain. She denies any dizziness, nausea, vomiting, or fever. Patient is not currently on any type of anticoagulation.  VSS.  Past Medical History  Diagnosis Date  . Asthma   . Abscess    Past Surgical History  Procedure Laterality Date  . Appendectomy    . Cholecystectomy    . Tonsillectomy     No family history on file. History  Substance Use Topics  . Smoking status: Current Every Day Smoker  . Smokeless tobacco: Never Used  . Alcohol Use: No   OB History    No data available     Review of Systems  Skin: Positive for wound.  All other systems reviewed and are negative.     Allergies  Codeine  Home Medications   Prior to Admission medications   Medication  Sig Start Date End Date Taking? Authorizing Provider  albuterol (PROVENTIL HFA;VENTOLIN HFA) 108 (90 BASE) MCG/ACT inhaler Inhale 2 puffs into the lungs every 6 (six) hours as needed for wheezing or shortness of breath. 02/08/14   [name], MD  azithromycin (ZITHROMAX) 250 MG tablet Take 1 tablet (250 mg total) by mouth daily. Take first 2 tablets together, then 1 every day until finished. 02/08/14   [name], MD  predniSONE (DELTASONE) 50 MG tablet Take 1 tablet (50 mg total) by mouth daily. 02/08/14   [name], MD   BP 156/76 mmHg  Pulse 67  Resp 16  SpO2 93%   Physical Exam  Constitutional: She is oriented to person, place, and time. She appears well-developed and well-nourished. No distress.  HENT:  Head: Normocephalic and atraumatic.  Mouth/Throat: Oropharynx is clear and moist.  Eyes: Conjunctivae and EOM are normal. Pupils are equal, round, and reactive to light.  Neck: Normal range of motion. Neck supple.  Cardiovascular: Normal rate, regular rhythm and normal heart sounds.   Pulmonary/Chest: Effort normal and breath sounds normal. No respiratory distress. She has no wheezes.  Abdominal: Soft. Bowel sounds are normal. There is no tenderness. There is no guarding.  Musculoskeletal: Normal range of motion.       Feet:  Solitary puncture wound to medial right great toe; bruising surrounding puncture wound  only; mild swelling noted of affected toe with extension to mid-dorsal foot but does not cross ankle joint; no significant erythema, induration, or streaking of foot/leg; strong DP pulse and cap refill; normal sensation throughout foot  Neurological: She is alert and oriented to person, place, and time.  Skin: Skin is warm and dry. She is not diaphoretic.  Psychiatric: She has a normal mood and affect.  Nursing note and vitals reviewed.       ED Course  Procedures (including critical care time) Labs Review Labs Reviewed  COMPREHENSIVE METABOLIC PANEL - Abnormal;  Notable for the following:    Potassium 3.4 (*)    Glucose, Bld 114 (*)    All other components within normal limits  CBC WITH DIFFERENTIAL/PLATELET - Abnormal; Notable for the following:    WBC 12.2 (*)    Hemoglobin 10.5 (*)    HCT 34.7 (*)    MCH 24.1 (*)    RDW 16.7 (*)    Platelets 456 (*)    Neutro Abs 7.8 (*)    All other components within normal limits  PROTIME-INR  FIBRINOGEN  CBC WITH DIFFERENTIAL/PLATELET  COMPREHENSIVE METABOLIC PANEL  PROTIME-INR  FIBRINOGEN    Imaging Review No results found.   EKG Interpretation None      MDM   Final diagnoses:  Snake bite, undetermined intent, initial encounter   44 year old female here with snake bite to right great toe. There was a dead copperhead found in dog pen, unsure if this is the snake that bit her. Patient is afebrile, nontoxic. She has a solitary puncture wound to medial right great toe with surrounding bruising. There is swelling from great toe extending into dorsal midfoot but does not extend past ankle.  There is no erythema, induration, or streaking up the foot or leg. Limb remains neurovascularly intact.  Lab work pending, tetanus will be updated. Foot elevated at this time.  10:32 PM Case discussed with poison control center-- remove anything on limb, no ice/compression, elevation only, draw line at site of swelling and monitor hourly.  Repeat coags in 4 hours, recommended monitoring for total of 6 hours.  If swelling progressing past 2 joint spaces (past mid-shin) consider anti-venom.  Call back if plan to administer crofab.  11:58 PM Patient reassessed.  No significant increase in swelling, still appears localized to right dorsal foot.  Swelling has not extended past ankle.  Remains without erythema, induration, or streaking.  Pain controlled with fentanyl for now.  Lab work reassuring.  Will continue to monitor.  12:58 AM Patient again reassessed.  Swelling may have extended slightly outside of prior  marking, still has not progressed past ankle.  Patient remains stable.  She was given dose of ativan as she has become very anxious, feels it is because she is not able to smoke.  She was offered nicotine patch multiple times which she declined.  Pain controlled at this time.  Care will be signed out to PA Piepenbrink-- will continue to monitor. She will follow-up on repeat labs which have been ordered to be drawn 4 hours after initial labs.  PRN dilaudid for pain.  If no complications noted and anti-venom not given, anticipate discharge after 6 hour observation.  Garlon HatchetLisa M Alysiana Ethridge, PA-C 08/01/14 0107  Alvira MondayErin Schlossman, MD 08/01/14 1345

## 2014-07-31 NOTE — ED Notes (Signed)
Bed: RESB Expected date:  Expected time:  Means of arrival:  Comments: EMS/copperhead bite

## 2014-08-01 LAB — COMPREHENSIVE METABOLIC PANEL
ALT: 22 U/L (ref 14–54)
ANION GAP: 5 (ref 5–15)
AST: 22 U/L (ref 15–41)
Albumin: 3.9 g/dL (ref 3.5–5.0)
Alkaline Phosphatase: 73 U/L (ref 38–126)
BUN: 11 mg/dL (ref 6–20)
CALCIUM: 8.6 mg/dL — AB (ref 8.9–10.3)
CO2: 26 mmol/L (ref 22–32)
CREATININE: 0.78 mg/dL (ref 0.44–1.00)
Chloride: 107 mmol/L (ref 101–111)
GFR calc non Af Amer: >60 mL/min (ref >60)
GLUCOSE: 160 mg/dL — AB (ref 65–99)
Potassium: 3.5 mmol/L (ref 3.5–5.1)
Sodium: 138 mmol/L (ref 135–145)
Total Bilirubin: 0.2 mg/dL — ABNORMAL LOW (ref 0.3–1.2)
Total Protein: 7.2 g/dL (ref 6.5–8.1)

## 2014-08-01 LAB — CBC WITH DIFFERENTIAL/PLATELET
BASOS ABS: 0.1 10*3/uL (ref 0.0–0.1)
BASOS PCT: 0 % (ref 0–1)
Eosinophils Absolute: 0.1 10*3/uL (ref 0.0–0.7)
Eosinophils Relative: 1 % (ref 0–5)
HCT: 34.2 % — ABNORMAL LOW (ref 36.0–46.0)
Hemoglobin: 10.3 g/dL — ABNORMAL LOW (ref 12.0–15.0)
LYMPHS PCT: 27 % (ref 12–46)
Lymphs Abs: 3.5 10*3/uL (ref 0.7–4.0)
MCH: 24.1 pg — ABNORMAL LOW (ref 26.0–34.0)
MCHC: 30.1 g/dL (ref 30.0–36.0)
MCV: 79.9 fL (ref 78.0–100.0)
MONO ABS: 0.9 10*3/uL (ref 0.1–1.0)
Monocytes Relative: 7 % (ref 3–12)
NEUTROS ABS: 8.7 10*3/uL — AB (ref 1.7–7.7)
NEUTROS PCT: 65 % (ref 43–77)
PLATELETS: 424 10*3/uL — AB (ref 150–400)
RBC: 4.28 MIL/uL (ref 3.87–5.11)
RDW: 16.7 % — ABNORMAL HIGH (ref 11.5–15.5)
WBC: 13.3 10*3/uL — AB (ref 4.0–10.5)

## 2014-08-01 LAB — PROTIME-INR
INR: 1.09 (ref 0.00–1.49)
PROTHROMBIN TIME: 14.3 s (ref 11.6–15.2)

## 2014-08-01 LAB — FIBRINOGEN: Fibrinogen: 373 mg/dL (ref 204–475)

## 2014-08-01 MED ORDER — TRAMADOL HCL 50 MG PO TABS: 50.0000 mg | ORAL_TABLET | Freq: Four times a day (QID) | ORAL | Status: DC | PRN

## 2014-08-01 MED ORDER — ONDANSETRON HCL 4 MG/2ML IJ SOLN
4.0000 mg | Freq: Once | INTRAMUSCULAR | Status: AC
  Administered 2014-08-01: 4 mg via INTRAVENOUS
  Filled 2014-08-01: qty 2

## 2014-08-01 MED ORDER — ONDANSETRON 4 MG PO TBDP: 4.0000 mg | ORAL_TABLET | Freq: Three times a day (TID) | ORAL | Status: DC | PRN

## 2014-08-01 MED ORDER — HYDROMORPHONE HCL 1 MG/ML IJ SOLN
1.0000 mg | INTRAMUSCULAR | Status: DC | PRN
  Administered 2014-08-01: 1 mg via INTRAVENOUS
  Filled 2014-08-01: qty 1

## 2014-08-01 MED ORDER — LORAZEPAM 2 MG/ML IJ SOLN
1.0000 mg | Freq: Once | INTRAMUSCULAR | Status: AC
  Administered 2014-08-01: 1 mg via INTRAVENOUS
  Filled 2014-08-01: qty 1

## 2014-08-01 NOTE — ED Notes (Addendum)
Patient has strong pedal pulse with cap refill less than 2 seconds. Pt able to move all toes and foot. Skin warm and dry. Sensation the same as right foot.

## 2014-08-01 NOTE — Discharge Instructions (Signed)
Please follow up with your primary care physician in 1-2 days. If you do not have one [facility] listed above. Please take pain medication and/or muscle relaxants as prescribed and as needed for pain. Please do not drive on narcotic pain medication or on muscle relaxants. Please read all discharge instructions and return precautions.   Snake Bite Snakes may be either venomous (containing poison) or nonvenomous (nonpoisonous). A nonvenomous snake bite will cause trauma or a wound to the skin and possibly the deeper tissues. A venomous snake will also cause a traumatic wound, but more importantly, it may have injected venom into the wound. Snake bite venom can be extremely serious and even deadly. One type of venom may cause major skin, tissue and muscle damage, and failure of normal blood clotting. This may cause extreme swelling and pain of the affected area. Another type of venom can affect the brain and nervous system and may cause death. The treatment for venomous snake bite may require the use of antivenom medicine. If you are unsure if your bite is from a venomous snake, you MUST seek immediate medical attention. YOU MIGHT NEED A TETANUS SHOT NOW IF:  You have no idea when you had the last one.  You have never had a tetanus shot before.  The bite broke your skin. If you need a tetanus shot, and you decide not to get one, there is a rare chance of getting tetanus. Sickness from tetanus can be serious. HOME CARE INSTRUCTIONS  A snake bit you and caused a skin wound. It may or may not have been venomous. If the snake was venomous, a small amount of venom may have been injected into your skin.  Keep the bite area clean and dry.  Keep the extremity elevated above the level of the heart for the next 48 hours.  Wash the bite area 3 times daily with soap and water or an antiseptic. Apply an adhesive or gauze bandage to the bite area.  If you develop  blistering of any type at the site of the bite, protect the blisters from breaking. Do not attempt to open it.  If you were given a tetanus shot, your arm may get swollen, red and warm at the shot site. This is a common response to the injection. SEEK IMMEDIATE MEDICAL CARE IF:   You develop symptoms of poisoning including increased pain, redness, swelling, blood blisters or purple spots in the bite area, nausea, vomiting, numbness, tingling, excessive sweating, breathing difficulty, blurred vision, feelings of lightheadedness, or feeling faint. If you develop symptoms of poisoning, you MUST seek immediate medical attention.  The bite becomes infected. Symptoms may include redness, swelling, pain, tenderness, pus, red streaks running from the wound, or an oral temperature above 102 F (38.9 C), not controlled by medicine.  Your condition or wound becomes worse. MAKE SURE YOU:   Understand these instructions.  Will watch your condition.  Will get help right away if you are not doing well or get worse. Document Released: 12/28/1999 Document Revised: 03/24/2011 Document Reviewed: 05/23/2009 [facility] Patient Information 2015 [employer] [place]. This information is not intended to replace advice given to you by your health care provider. Make sure you discuss any questions you have with your health care provider.

## 2014-08-01 NOTE — ED Notes (Addendum)
Spoke with poison control, provided updated information concerning about. Recommended if they swelling increases, progresses, then she may need anti-vitum.

## 2014-08-01 NOTE — ED Provider Notes (Signed)
Patient care acquired from Sharilyn Sites, PA-C.  Results for orders placed or performed during the hospital encounter of 07/31/14  Protime-INR  Result Value Ref Range   Prothrombin Time 13.8 11.6 - 15.2 seconds   INR 1.04 0.00 - 1.49  Fibrinogen  Result Value Ref Range   Fibrinogen 362 204 - 475 mg/dL  Comprehensive metabolic panel  Result Value Ref Range   Sodium 137 135 - 145 mmol/L   Potassium 3.4 (L) 3.5 - 5.1 mmol/L   Chloride 104 101 - 111 mmol/L   CO2 27 22 - 32 mmol/L   Glucose, Bld 114 (H) 65 - 99 mg/dL   BUN 11 6 - 20 mg/dL   Creatinine, Ser 3.08 0.44 - 1.00 mg/dL   Calcium 9.0 8.9 - 65.7 mg/dL   Total Protein 7.8 6.5 - 8.1 g/dL   Albumin 4.1 3.5 - 5.0 g/dL   AST 20 15 - 41 U/L   ALT 23 14 - 54 U/L   Alkaline Phosphatase 75 38 - 126 U/L   Total Bilirubin 0.5 0.3 - 1.2 mg/dL   GFR calc non Af Amer >60 >60 mL/min   GFR calc Af Amer >60 >60 mL/min   Anion gap 6 5 - 15  CBC with Differential  Result Value Ref Range   WBC 12.2 (H) 4.0 - 10.5 K/uL   RBC 4.36 3.87 - 5.11 MIL/uL   Hemoglobin 10.5 (L) 12.0 - 15.0 g/dL   HCT 84.6 (L) 96.2 - 95.2 %   MCV 79.6 78.0 - 100.0 fL   MCH 24.1 (L) 26.0 - 34.0 pg   MCHC 30.3 30.0 - 36.0 g/dL   RDW 84.1 (H) 32.4 - 40.1 %   Platelets 456 (H) 150 - 400 K/uL   Neutrophils Relative % 64 43 - 77 %   Neutro Abs 7.8 (H) 1.7 - 7.7 K/uL   Lymphocytes Relative 28 12 - 46 %   Lymphs Abs 3.5 0.7 - 4.0 K/uL   Monocytes Relative 6 3 - 12 %   Monocytes Absolute 0.7 0.1 - 1.0 K/uL   Eosinophils Relative 1 0 - 5 %   Eosinophils Absolute 0.1 0.0 - 0.7 K/uL   Basophils Relative 1 0 - 1 %   Basophils Absolute 0.1 0.0 - 0.1 K/uL  CBC with Differential  Result Value Ref Range   WBC 13.3 (H) 4.0 - 10.5 K/uL   RBC 4.28 3.87 - 5.11 MIL/uL   Hemoglobin 10.3 (L) 12.0 - 15.0 g/dL   HCT 02.7 (L) 25.3 - 66.4 %   MCV 79.9 78.0 - 100.0 fL   MCH 24.1 (L) 26.0 - 34.0 pg   MCHC 30.1 30.0 - 36.0 g/dL   RDW 40.3 (H) 47.4 - 25.9 %   Platelets 424 (H) 150 -  400 K/uL   Neutrophils Relative % 65 43 - 77 %   Neutro Abs 8.7 (H) 1.7 - 7.7 K/uL   Lymphocytes Relative 27 12 - 46 %   Lymphs Abs 3.5 0.7 - 4.0 K/uL   Monocytes Relative 7 3 - 12 %   Monocytes Absolute 0.9 0.1 - 1.0 K/uL   Eosinophils Relative 1 0 - 5 %   Eosinophils Absolute 0.1 0.0 - 0.7 K/uL   Basophils Relative 0 0 - 1 %   Basophils Absolute 0.1 0.0 - 0.1 K/uL  Comprehensive metabolic panel  Result Value Ref Range   Sodium 138 135 - 145 mmol/L   Potassium 3.5 3.5 - 5.1 mmol/L   Chloride  107 101 - 111 mmol/L   CO2 26 22 - 32 mmol/L   Glucose, Bld 160 (H) 65 - 99 mg/dL   BUN 11 6 - 20 mg/dL   Creatinine, Ser 1.610.78 0.44 - 1.00 mg/dL   Calcium 8.6 (L) 8.9 - 10.3 mg/dL   Total Protein 7.2 6.5 - 8.1 g/dL   Albumin 3.9 3.5 - 5.0 g/dL   AST 22 15 - 41 U/L   ALT 22 14 - 54 U/L   Alkaline Phosphatase 73 38 - 126 U/L   Total Bilirubin 0.2 (L) 0.3 - 1.2 mg/dL   GFR calc non Af Amer >60 >60 mL/min   GFR calc Af Amer >60 >60 mL/min   Anion gap 5 5 - 15  Protime-INR  Result Value Ref Range   Prothrombin Time 14.3 11.6 - 15.2 seconds   INR 1.09 0.00 - 1.49  Fibrinogen  Result Value Ref Range   Fibrinogen 373 204 - 475 mg/dL   No results found.  I have reviewed nursing notes, vital signs, and all lab results as noted above.  Neurovascularly intact. Normal sensation. No evidence of compartment syndrome. Swelling appears improved. PT/INR, Platelets and Fibronogen stable.  4:10 AM Discussed with poison control who feels patient is stable for DC.Discussed this with the patient who is upset that she is not receiving anti-venom. She is also demanding to go home with dilaudid.   Return precautions discussed. Patient is stable at time of discharge. Patient d/w with Dr. Norlene Campbelltter, agrees with plan.    Francee PiccoloJennifer Shea Swalley, PA-C 08/01/14 [id number]  Marisa Severinlga Otter, MD 08/01/14 [phone]

## 2015-04-14 ENCOUNTER — Encounter (HOSPITAL_COMMUNITY): Payer: Self-pay | Admitting: Physical Medicine and Rehabilitation

## 2015-04-14 ENCOUNTER — Emergency Department (HOSPITAL_COMMUNITY): Payer: Medicaid Other

## 2015-04-14 ENCOUNTER — Emergency Department (HOSPITAL_COMMUNITY)
Admission: EM | Admit: 2015-04-14 | Discharge: 2015-04-14 | Disposition: A | Discharge status: Home / Self Care | Payer: Medicaid Other | Attending: Emergency Medicine | Admitting: Emergency Medicine

## 2015-04-14 DIAGNOSIS — J069 Acute upper respiratory infection, unspecified: Secondary | ICD-10-CM | POA: Diagnosis not present

## 2015-04-14 DIAGNOSIS — R05 Cough: Secondary | ICD-10-CM | POA: Diagnosis present

## 2015-04-14 DIAGNOSIS — Z87891 Personal history of nicotine dependence: Secondary | ICD-10-CM | POA: Insufficient documentation

## 2015-04-14 DIAGNOSIS — Z79899 Other long term (current) drug therapy: Secondary | ICD-10-CM | POA: Diagnosis not present

## 2015-04-14 DIAGNOSIS — J45901 Unspecified asthma with (acute) exacerbation: Secondary | ICD-10-CM | POA: Insufficient documentation

## 2015-04-14 MED ORDER — IPRATROPIUM-ALBUTEROL 0.5-2.5 (3) MG/3ML IN SOLN
3.0000 mL | Freq: Once | RESPIRATORY_TRACT | Status: AC
  Administered 2015-04-14: 3 mL via RESPIRATORY_TRACT
  Filled 2015-04-14: qty 3

## 2015-04-14 MED ORDER — PREDNISONE 50 MG PO TABS: ORAL_TABLET | ORAL | Status: DC

## 2015-04-14 NOTE — ED Notes (Signed)
Pt reports generalized body aches, cough and fatigue. Ongoing x1 week.

## 2015-04-14 NOTE — ED Notes (Signed)
Declined W/C at D/C and was escorted to lobby by RN. 

## 2015-04-14 NOTE — ED Provider Notes (Signed)
CSN: [id number]     Arrival date & time 04/14/15  0904 History  By signing my name below, I, Rohini Rajnarayanan, attest that this documentation has been prepared under the direction and in the presence of Gaylyn RongSamantha Dowless PA-C Electronically Signed: Charlean Merlohini Rajnarayanan, ED Scribe 04/14/2015 at 9:46 AM.   Chief Complaint  Patient presents with  . Generalized Body Aches  . Cough   The history is provided by the patient. No language interpreter was used.    HPI Comments: [name] is a 45 y.o. female with a pmhx of asthma, who presents to the Emergency Department complaining of generalized body aches, chills, fatigue, productive cough, HA, and subjective fever x1 week. Pt's sx have been mostly constant with very slight improvement. Pt's husband has also been sick, but he has seen some relief in the past couple of days. Pt has taken Tylenol with little relief. Pt is staying hydrated, and is trying to uphold an adequate appetite. Pt denies any sore throat, ear pain, or emesis. Pt is currently trying to stop smoking.   Past Medical History  Diagnosis Date  . Asthma   . Abscess    Past Surgical History  Procedure Laterality Date  . Appendectomy    . Cholecystectomy    . Tonsillectomy     No family history on file. Social History  Substance Use Topics  . Smoking status: Former Games developermoker  . Smokeless tobacco: Never Used  . Alcohol Use: No   OB History    No data available     Review of Systems  All other systems reviewed and are negative.  10 Systems reviewed and all are negative for acute change except as noted in the HPI.  Allergies  Codeine  Home Medications   Prior to Admission medications   Medication Sig Start Date End Date Taking? Authorizing Provider  albuterol (PROVENTIL HFA;VENTOLIN HFA) 108 (90 BASE) MCG/ACT inhaler Inhale 2 puffs into the lungs every 6 (six) hours as needed for wheezing or shortness of breath. 02/08/14   Rodolph BongEvan S Corey, MD   diphenhydramine-acetaminophen (TYLENOL PM) 25-500 MG TABS Take 1 tablet by mouth at bedtime as needed (sleep/pain).    Historical Provider, MD  Multiple Vitamin (MULTIVITAMIN WITH MINERALS) TABS tablet Take 1 tablet by mouth daily.    Historical Provider, MD  omeprazole (PRILOSEC OTC) 20 MG tablet Take 20 mg by mouth daily as needed (heartburn).    Historical Provider, MD  ondansetron (ZOFRAN ODT) 4 MG disintegrating tablet Take 1 tablet (4 mg total) by mouth every 8 (eight) hours as needed for nausea. 08/01/14   [name], PA-C  traMADol (ULTRAM) 50 MG tablet Take 1 tablet (50 mg total) by mouth every 6 (six) hours as needed. 08/01/14   [name], PA-C   BP 129/71 mmHg  Pulse 83  Temp(Src) 99.3 F (37.4 C) (Oral)  Resp 18  Ht 5\' 1"  (1.549 m)  Wt 160 lb (72.576 kg)  BMI 30.25 kg/m2  SpO2 99% Physical Exam  Constitutional: She is oriented to person, place, and time. She appears well-developed and well-nourished. No distress.  HENT:  Head: Normocephalic and atraumatic.  Mouth/Throat: No oropharyngeal exudate.  TM clear b/l.  Eyes: Conjunctivae are normal. Right eye exhibits no discharge. Left eye exhibits no discharge. No scleral icterus.  Cardiovascular: Normal rate, regular rhythm, normal heart sounds and intact distal pulses.  Exam reveals no gallop and no friction rub.   No murmur heard. Pulmonary/Chest: Effort normal. She has wheezes (Expiratory  wheezes in b/l lower lung fields. ).  Neurological: She is alert and oriented to person, place, and time. Coordination normal.  Skin: Skin is warm and dry. No rash noted. She is not diaphoretic. No erythema. No pallor.  Psychiatric: She has a normal mood and affect. Her behavior is normal.  Nursing note and vitals reviewed.   ED Course  Procedures  DIAGNOSTIC STUDIES: Oxygen Saturation is 99% on RA, normal by my interpretation.    COORDINATION OF CARE: 9:37 AM-Discussed treatment plan which includes DG Chest,   with pt at bedside and pt agreed to plan.  Labs Review Labs Reviewed - No data to display  Imaging Review Dg Chest 2 View  04/14/2015  CLINICAL DATA:  Cough. EXAM: CHEST  2 VIEW COMPARISON:  April 01, 2003. FINDINGS: The heart size and mediastinal contours are within normal limits. Both lungs are clear. The visualized skeletal structures are unremarkable. IMPRESSION: No active cardiopulmonary disease. Electronically Signed   By: Lupita Raider, M.D.   On: 04/14/2015 10:34   I have personally reviewed and evaluated these images and lab results as part of my medical decision-making.   EKG Interpretation None      MDM   Final diagnoses:  Viral URI   45 y.o F presents with symptoms consistent with URI. Pt appears well in ED, non-toxic, non-septic. All VSS. Pt with mild expiratory wheezes on exam. Pt is a smoker and has hx of asthma. Duoneb given with significant symptomatic relief. Repeat lung exam is much improved. CXR negative for acute infiltrate. Pt will be discharged with symptomatic treatment.  Discussed return precautions.  Pt is hemodynamically stable & in NAD prior to discharge.   I personally performed the services described in this documentation, which was scribed in my presence. The recorded information has been reviewed and is accurate.       Lester Kinsman [place], PA-C 04/14/15 1132  Nelva Nay, MD 04/19/15 [phone]

## 2015-04-14 NOTE — Discharge Instructions (Signed)
Upper Respiratory Infection, Adult Most upper respiratory infections (URIs) are a viral infection of the air passages leading to the lungs. A URI affects the nose, throat, and upper air passages. The most common type of URI is nasopharyngitis and is typically referred to as "the common cold." URIs run their course and usually go away on their own. Most of the time, a URI does not require medical attention, but sometimes a bacterial infection in the upper airways can follow a viral infection. This is called a secondary infection. Sinus and middle ear infections are common types of secondary upper respiratory infections. Bacterial pneumonia can also complicate a URI. A URI can worsen asthma and chronic obstructive pulmonary disease (COPD). Sometimes, these complications can require emergency medical care and may be life threatening.  CAUSES Almost all URIs are caused by viruses. A virus is a type of germ and can spread from one person to another.  RISKS FACTORS You may be at risk for a URI if:   You smoke.   You have chronic heart or lung disease.  You have a weakened defense (immune) system.   You are very young or very old.   You have nasal allergies or asthma.  You work in crowded or poorly ventilated areas.  You work in health care facilities or schools. SIGNS AND SYMPTOMS  Symptoms typically develop 2-3 days after you come in contact with a cold virus. Most viral URIs last 7-10 days. However, viral URIs from the influenza virus (flu virus) can last 14-18 days and are typically more severe. Symptoms may include:   Runny or stuffy (congested) nose.   Sneezing.   Cough.   Sore throat.   Headache.   Fatigue.   Fever.   Loss of appetite.   Pain in your forehead, behind your eyes, and over your cheekbones (sinus pain).  Muscle aches.  DIAGNOSIS  Your health care provider may diagnose a URI by:  Physical exam.  Tests to check that your symptoms are not due to  another condition such as:  Strep throat.  Sinusitis.  Pneumonia.  Asthma. TREATMENT  A URI goes away on its own with time. It cannot be cured with medicines, but medicines may be prescribed or recommended to relieve symptoms. Medicines may help:  Reduce your fever.  Reduce your cough.  Relieve nasal congestion. HOME CARE INSTRUCTIONS   Take medicines only as directed by your health care provider.   Gargle warm saltwater or take cough drops to comfort your throat as directed by your health care provider.  Use a warm mist humidifier or inhale steam from a shower to increase air moisture. This may make it easier to breathe.  Drink enough fluid to keep your urine clear or pale yellow.   Eat soups and other clear broths and maintain good nutrition.   Rest as needed.   Return to work when your temperature has returned to normal or as your health care provider advises. You may need to stay home longer to avoid infecting others. You can also use a face mask and careful hand washing to prevent spread of the virus.  Increase the usage of your inhaler if you have asthma.   Do not use any tobacco products, including cigarettes, chewing tobacco, or electronic cigarettes. If you need help quitting, ask your health care provider. PREVENTION  The best way to protect yourself from getting a cold is to practice good hygiene.   Avoid oral or hand contact with people with cold  symptoms.   Wash your hands often if contact occurs.  There is no clear evidence that vitamin C, vitamin E, echinacea, or exercise reduces the chance of developing a cold. However, it is always recommended to get plenty of rest, exercise, and practice good nutrition.  SEEK MEDICAL CARE IF:   You are getting worse rather than better.   Your symptoms are not controlled by medicine.   You have chills.  You have worsening shortness of breath.  You have brown or red mucus.  You have yellow or brown nasal  discharge.  You have pain in your face, especially when you bend forward.  You have a fever.  You have swollen neck glands.  You have pain while swallowing.  You have white areas in the back of your throat. SEEK IMMEDIATE MEDICAL CARE IF:   You have severe or persistent:  Headache.  Ear pain.  Sinus pain.  Chest pain.  You have chronic lung disease and any of the following:  Wheezing.  Prolonged cough.  Coughing up blood.  A change in your usual mucus.  You have a stiff neck.  You have changes in your:  Vision.  Hearing.  Thinking.  Mood. MAKE SURE YOU:   Understand these instructions.  Will watch your condition.  Will get help right away if you are not doing well or get worse.   This information is not intended to replace advice given to you by your health care provider. Make sure you discuss any questions you have with your health care provider.  Take prednisone as prescribed. Continue alternating tylenol and ibuprofen avery 4-6 hours for fever. Encourage smoking cessation and adequate hydration, drink plenty of fluids. Return to the ED if you experience severe worsening of your symptoms, increased fever, difficulty breathing, vomiting, difficulty swallowing. Otherwise follow up with your primary care provider or [facility].

## 2015-05-16 ENCOUNTER — Encounter (HOSPITAL_COMMUNITY): Payer: Self-pay | Admitting: Emergency Medicine

## 2015-05-16 ENCOUNTER — Emergency Department (HOSPITAL_COMMUNITY)
Admission: EM | Admit: 2015-05-16 | Discharge: 2015-05-16 | Disposition: A | Discharge status: Home / Self Care | Payer: Medicaid Other | Attending: Emergency Medicine | Admitting: Emergency Medicine

## 2015-05-16 DIAGNOSIS — Z79899 Other long term (current) drug therapy: Secondary | ICD-10-CM | POA: Insufficient documentation

## 2015-05-16 DIAGNOSIS — J45909 Unspecified asthma, uncomplicated: Secondary | ICD-10-CM | POA: Diagnosis not present

## 2015-05-16 DIAGNOSIS — L0291 Cutaneous abscess, unspecified: Secondary | ICD-10-CM

## 2015-05-16 DIAGNOSIS — F17200 Nicotine dependence, unspecified, uncomplicated: Secondary | ICD-10-CM | POA: Diagnosis not present

## 2015-05-16 DIAGNOSIS — L02416 Cutaneous abscess of left lower limb: Secondary | ICD-10-CM | POA: Insufficient documentation

## 2015-05-16 MED ORDER — ACETAMINOPHEN 325 MG PO TABS
650.0000 mg | ORAL_TABLET | Freq: Once | ORAL | Status: AC
  Administered 2015-05-16: 650 mg via ORAL
  Filled 2015-05-16: qty 2

## 2015-05-16 MED ORDER — SULFAMETHOXAZOLE-TRIMETHOPRIM 800-160 MG PO TABS
1.0000 | ORAL_TABLET | Freq: Once | ORAL | Status: AC
  Administered 2015-05-16: 1 via ORAL
  Filled 2015-05-16: qty 1

## 2015-05-16 MED ORDER — SULFAMETHOXAZOLE-TRIMETHOPRIM 800-160 MG PO TABS: 1.0000 | ORAL_TABLET | Freq: Two times a day (BID) | ORAL | Status: AC

## 2015-05-16 MED ORDER — ACETAMINOPHEN 500 MG PO TABS: 500.0000 mg | ORAL_TABLET | Freq: Four times a day (QID) | ORAL | Status: AC | PRN

## 2015-05-16 MED ORDER — LIDOCAINE HCL 1 % IJ SOLN
20.0000 mL | Freq: Once | INTRAMUSCULAR | Status: DC
  Filled 2015-05-16: qty 20

## 2015-05-16 MED ORDER — LIDOCAINE HCL (PF) 1 % IJ SOLN
30.0000 mL | Freq: Once | INTRAMUSCULAR | Status: AC
  Administered 2015-05-16: 30 mL via INTRADERMAL
  Filled 2015-05-16: qty 30

## 2015-05-16 NOTE — ED Notes (Signed)
C/o abscess to L medial thigh x 2 weeks.

## 2015-05-16 NOTE — ED Notes (Signed)
See PA assessment 

## 2015-05-16 NOTE — ED Provider Notes (Signed)
CSN: [id number]     Arrival date & time 05/16/15  2051 History  By signing my name below, I, [name], attest that this documentation has been prepared under the direction and in the presence of [name], PA-C. Electronically Signed: Evon Reed, ED Scribe. 05/16/2015. 10:07 PM.    Chief Complaint  Patient presents with  . Abscess    The history is provided by the patient. No language interpreter was used.    HPI Comments: [name] is a 45 y.o. female who presents to the Emergency Department complaining of abscess to left medial thigh onset 1 week prior. Pt reports some associated drainage. Pt states she has tried epsom salt and draining the abscess with no relief. Pt denies fever, chills, nausea, or vomiting. Pt reports HX of abscesses.    Past Medical History  Diagnosis Date  . Asthma   . Abscess    Past Surgical History  Procedure Laterality Date  . Appendectomy    . Cholecystectomy    . Tonsillectomy     No family history on file. Social History  Substance Use Topics  . Smoking status: Current Every Day Smoker  . Smokeless tobacco: Never Used  . Alcohol Use: No   OB History    No data available      Review of Systems  Constitutional: Negative for fever and chills.  Gastrointestinal: Negative for nausea and vomiting.  Skin:       +abscess     Allergies  Codeine  Home Medications   Prior to Admission medications   Medication Sig Start Date End Date Taking? Authorizing Provider  acetaminophen (TYLENOL) 500 MG tablet Take 1 tablet (500 mg total) by mouth every 6 (six) hours as needed. 05/16/15   [name], PA-C  albuterol (PROVENTIL HFA;VENTOLIN HFA) 108 (90 BASE) MCG/ACT inhaler Inhale 2 puffs into the lungs every 6 (six) hours as needed for wheezing or shortness of breath. 02/08/14   [name], MD  diphenhydramine-acetaminophen (TYLENOL PM) 25-500 MG TABS Take 1 tablet by mouth at bedtime as needed (sleep/pain).     Historical Provider, MD  Multiple Vitamin (MULTIVITAMIN WITH MINERALS) TABS tablet Take 1 tablet by mouth daily.    Historical Provider, MD  omeprazole (PRILOSEC OTC) 20 MG tablet Take 20 mg by mouth daily as needed (heartburn).    Historical Provider, MD  ondansetron (ZOFRAN ODT) 4 MG disintegrating tablet Take 1 tablet (4 mg total) by mouth every 8 (eight) hours as needed for nausea. 08/01/14   [name], PA-C  predniSONE (DELTASONE) 50 MG tablet Take once daily for 5 days 04/14/15   [name], PA-C  sulfamethoxazole-trimethoprim (BACTRIM DS,SEPTRA DS) 800-160 MG tablet Take 1 tablet by mouth 2 (two) times daily. 05/16/15 05/23/15  [name], PA-C  traMADol (ULTRAM) 50 MG tablet Take 1 tablet (50 mg total) by mouth every 6 (six) hours as needed. 08/01/14   [name], PA-C    BP 148/77 mmHg  Pulse 76  Temp(Src) 97.8 F (36.6 C) (Oral)  Resp 20  Ht  (1.549 m)  Wt 72.576 kg  BMI 30.25 kg/m2  SpO2 96%  Physical Exam  Constitutional: She is oriented to person, place, and time. She appears well-developed and well-nourished. No distress.  HENT:  Head: Normocephalic and atraumatic.  Right Ear: External ear normal.  Left Ear: External ear normal.  Nose: Nose normal.  Eyes: Conjunctivae and EOM are normal. Right eye exhibits no discharge. Left eye  exhibits no discharge. No scleral icterus.  Neck: Normal range of motion. Neck supple.  Cardiovascular: Normal rate and regular rhythm.   Pulmonary/Chest: Effort normal and breath sounds normal. No respiratory distress.  Musculoskeletal: Normal range of motion. She exhibits no edema or tenderness.  Neurological: She is alert and oriented to person, place, and time.  Skin: Skin is warm and dry. She is not diaphoretic. There is erythema.  2 cm area of fluctuance to left medial proximal thigh with mild surrounding erythema.   Psychiatric: She has a normal mood and affect. Her behavior is normal.  Nursing  note and vitals reviewed.   ED Course  .Marland Kitchen.Incision and Drainage Date/Time: 05/16/2015 10:35 PM Performed by: [name], ELIZABETH C Authorized by: [name], ELIZABETH C Consent: Verbal consent obtained. Risks and benefits: risks, benefits and alternatives were discussed Consent given by: patient Patient understanding: patient states understanding of the procedure being performed Patient consent: the patient's understanding of the procedure matches consent given Procedure consent: procedure consent matches procedure scheduled Relevant documents: relevant documents present and verified Site marked: the operative site was marked Required items: required blood products, implants, devices, and special equipment available Patient identity confirmed: verbally with patient and anonymous protocol, patient vented/unresponsive Type: abscess Body area: lower extremity (left proximal medial thigh) Anesthesia: local infiltration Local anesthetic: lidocaine 1% without epinephrine Patient sedated: no Scalpel size: 11 Needle gauge: 22 Incision type: single straight Incision depth: dermal Complexity: complex Drainage: purulent Drainage amount: scant Wound treatment: wound left open Packing material: none Patient tolerance: Patient tolerated the procedure well with no immediate complications    DIAGNOSTIC STUDIES: Oxygen Saturation is 98% on RA, normal by my interpretation.    COORDINATION OF CARE: 10:07 PM-Discussed treatment plan with pt at bedside and pt agreed to plan.   Labs Review Labs Reviewed - No data to display  Imaging Review No results found.    EKG Interpretation None      MDM   Final diagnoses:  Abscess    45 year old female presents with abscess to her left inner thigh, which she states has been present for the past week. History of similar symptoms. Denies fever, chills, nausea, vomiting. Patient is afebrile. Vital signs stable. On exam, patient has a 2 cm area of  fluctuance to her left medial proximal thigh with mild surrounding erythema. Abscess drained in the ED, which the patient tolerated well. Given mild residual induration and erythema, will discharge with bactrim. Advised to use warm compresses to encourage continued drainage. Patient to follow-up in 2 days for wound recheck. Strict return precautions discussed. Patient verbalizes her understanding and is in agreement with plan.  BP 148/77 mmHg  Pulse 76  Temp(Src) 97.8 F (36.6 C) (Oral)  Resp 20  Ht 5\' 1"  (1.549 m)  Wt 72.576 kg  BMI 30.25 kg/m2  SpO2 96%  I personally performed the services described in this documentation, which was scribed in my presence. The recorded information has been reviewed and is accurate.       Mady Gemmalizabeth C Westfall, PA-C 05/16/15 2241  Arby BarretteMarcy Pfeiffer, MD 05/24/15 1231

## 2015-05-16 NOTE — Discharge Instructions (Signed)
1. Medications: bactrim, tylenol or ibuprofen for pain, usual home medications 2. Treatment: rest, drink plenty of fluids 3. Follow Up: please followup in 2 days for wound recheck; if you do not have a primary care doctor use the phone number listed in your discharge paperwork to find one; please return to the ER for increased pain, swelling, redness, new or worsening symptoms   Abscess An abscess (boil or furuncle) is an infected area on or under the skin. This area is filled with yellowish-white fluid (pus) and other material (debris). HOME CARE   Only take medicines as told by your doctor.  If you were given antibiotic medicine, take it as directed. Finish the medicine even if you start to feel better.  If gauze is used, follow your doctor's directions for changing the gauze.  To avoid spreading the infection:  Keep your abscess covered with a bandage.  Wash your hands well.  Do not share personal care items, towels, or whirlpools with others.  Avoid skin contact with others.  Keep your skin and clothes clean around the abscess.  Keep all doctor visits as told. GET HELP RIGHT AWAY IF:   You have more pain, puffiness (swelling), or redness in the wound site.  You have more fluid or blood coming from the wound site.  You have muscle aches, chills, or you feel sick.  You have a fever. MAKE SURE YOU:   Understand these instructions.  Will watch your condition.  Will get help right away if you are not doing well or get worse.   This information is not intended to replace advice given to you by your health care provider. Make sure you discuss any questions you have with your health care provider.   Document Released: 06/18/2007 Document Revised: 07/01/2011 Document Reviewed: 03/15/2011 Elsevier Interactive Patient Education [employer]

## 2016-08-18 ENCOUNTER — Encounter (HOSPITAL_COMMUNITY): Payer: Self-pay | Admitting: Emergency Medicine

## 2016-08-18 ENCOUNTER — Ambulatory Visit (HOSPITAL_COMMUNITY)
Admission: EM | Admit: 2016-08-18 | Discharge: 2016-08-18 | Disposition: A | Discharge status: Home / Self Care | Payer: Medicaid Other | Attending: Internal Medicine | Admitting: Internal Medicine

## 2016-08-18 DIAGNOSIS — M542 Cervicalgia: Secondary | ICD-10-CM

## 2016-08-18 DIAGNOSIS — M5489 Other dorsalgia: Secondary | ICD-10-CM

## 2016-08-18 DIAGNOSIS — T148XXA Other injury of unspecified body region, initial encounter: Secondary | ICD-10-CM

## 2016-08-18 MED ORDER — NAPROXEN 500 MG PO TABS: 500.0000 mg | ORAL_TABLET | Freq: Two times a day (BID) | ORAL | 0 refills | Status: DC

## 2016-08-18 MED ORDER — CYCLOBENZAPRINE HCL 10 MG PO TABS: 5.0000 mg | ORAL_TABLET | Freq: Two times a day (BID) | ORAL | 0 refills | Status: AC | PRN
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
## 2016-08-18 NOTE — ED Notes (Signed)
No answer
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
## 2016-08-18 NOTE — Discharge Instructions (Signed)
Your exam showed muscle strains of your neck and back. You can experience increased muscle soreness the next 24-48 hours. This is normal. Start NSAID as directed for pain and inflammation. Muscle relaxant as needed. Ice/heat compresses. This can take up to 2-3 weeks to resolve, but should be getting better each week. Monitor for worsening of symptoms, headache, sensitivity to light, nausea, vomiting, numbness, tingling, loss of bladder or bowel control, go to the ED for further evaluation.

## 2016-08-20 ENCOUNTER — Ambulatory Visit (HOSPITAL_COMMUNITY)
Admission: EM | Admit: 2016-08-20 | Discharge: 2016-08-20 | Disposition: A | Discharge status: Home / Self Care | Payer: Medicaid Other | Attending: Internal Medicine | Admitting: Internal Medicine

## 2016-08-20 ENCOUNTER — Encounter (HOSPITAL_COMMUNITY): Payer: Self-pay | Admitting: *Deleted

## 2016-08-20 DIAGNOSIS — T148XXA Other injury of unspecified body region, initial encounter: Secondary | ICD-10-CM

## 2016-08-20 DIAGNOSIS — M542 Cervicalgia: Secondary | ICD-10-CM

## 2016-08-20 DIAGNOSIS — T148XXD Other injury of unspecified body region, subsequent encounter: Secondary | ICD-10-CM

## 2016-08-20 DIAGNOSIS — M5489 Other dorsalgia: Secondary | ICD-10-CM

## 2016-08-20 MED ORDER — MELOXICAM 15 MG PO TABS: 15.0000 mg | ORAL_TABLET | Freq: Every day | ORAL | 0 refills | Status: AC
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
## 2016-08-20 NOTE — Discharge Instructions (Signed)
Use the Salon Pas menthol patch to the left side of your neck. May also apply gentle heat such as a heating pad or from a care wrap to the area. Return instructions and gradually start performing gentle stretches. Can also apply heat to the thigh muscles and lower left back. Take the Mobic for pain and stop taking the Naprosyn since that upsets her stomach.
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
## 2016-10-03 IMAGING — DX DG CHEST 2V
2 series · 2 of 2 positions shown · non-contrast
Comparison: April 01, 2003.

CLINICAL DATA: Cough.

EXAM:
CHEST  2 VIEW

[chest pa]
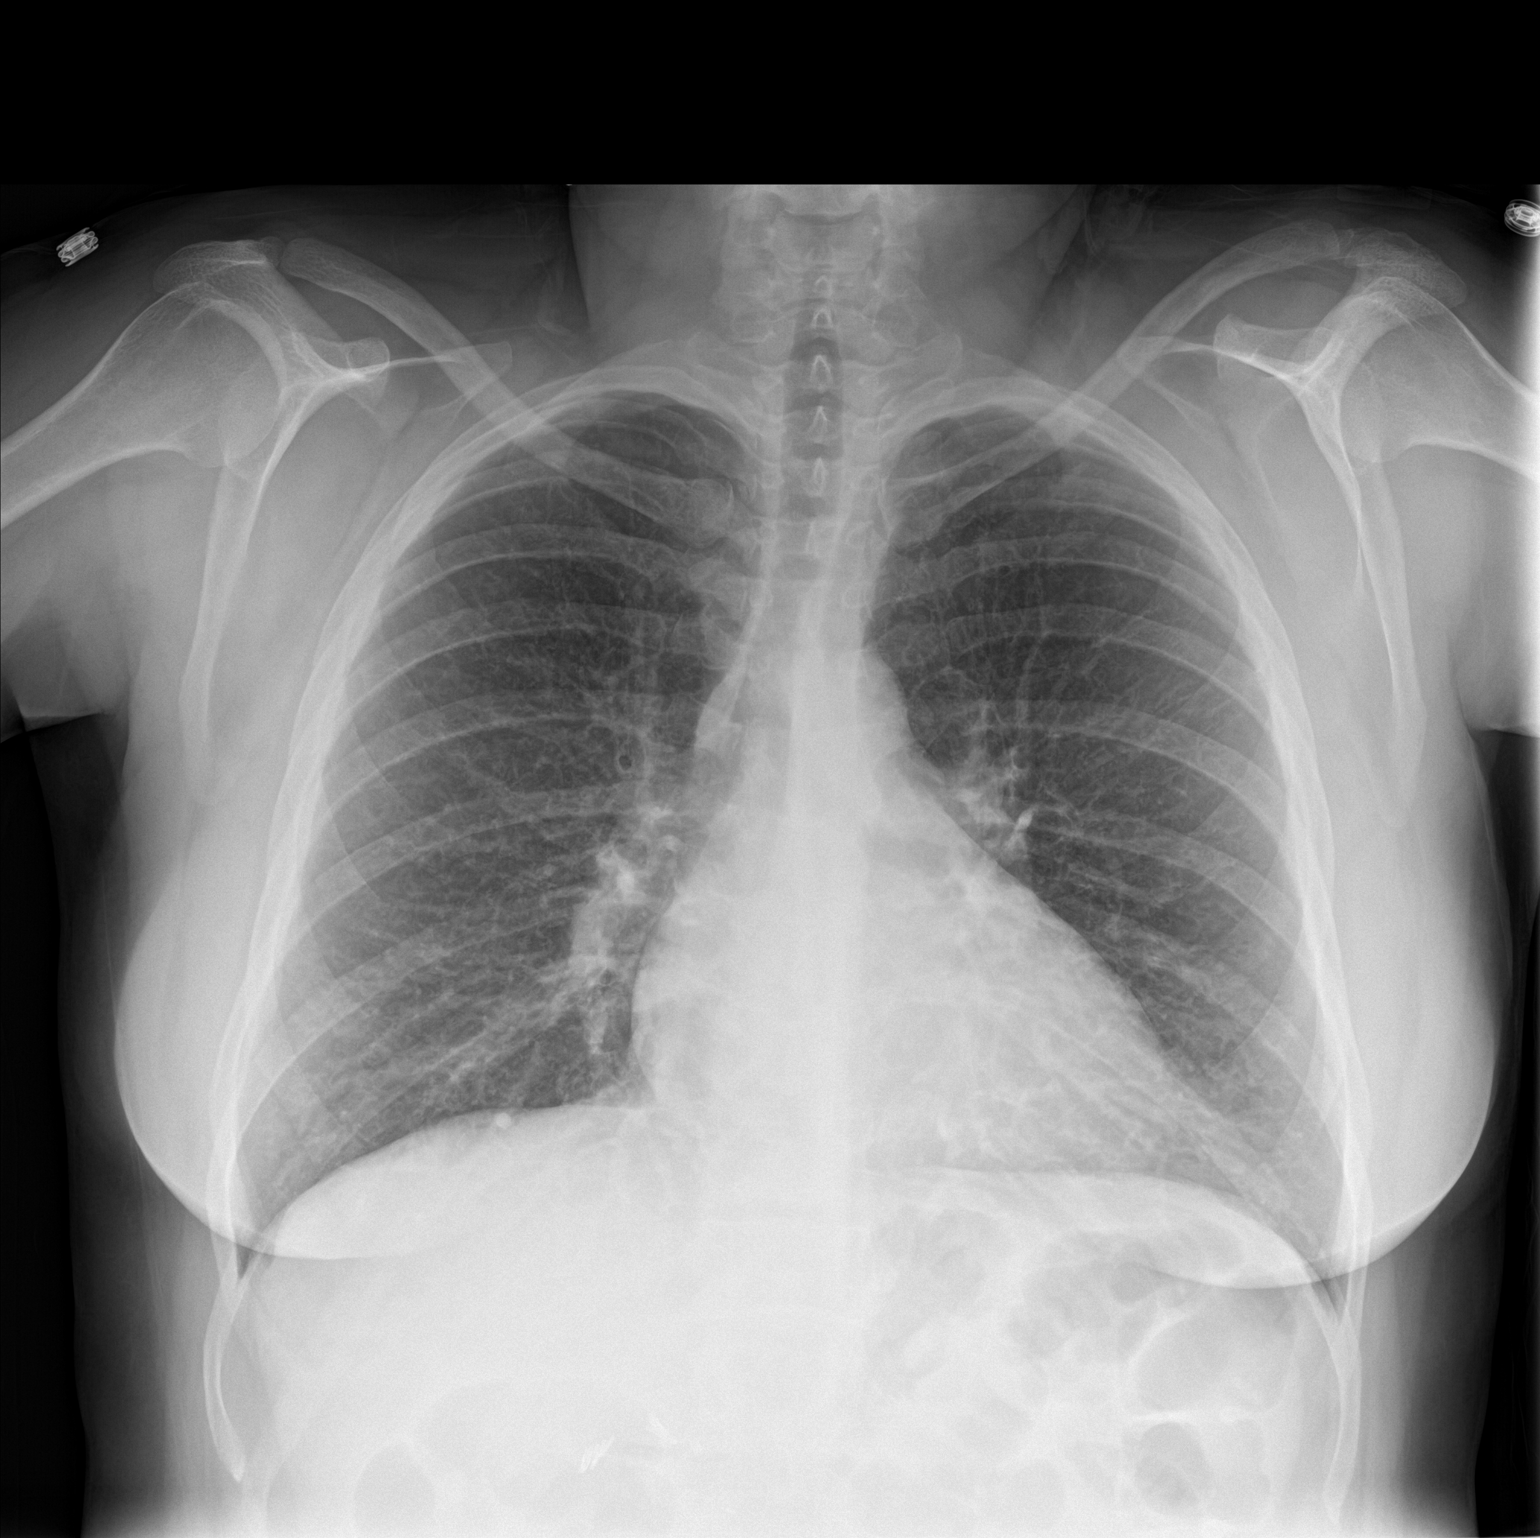

[chest lat]
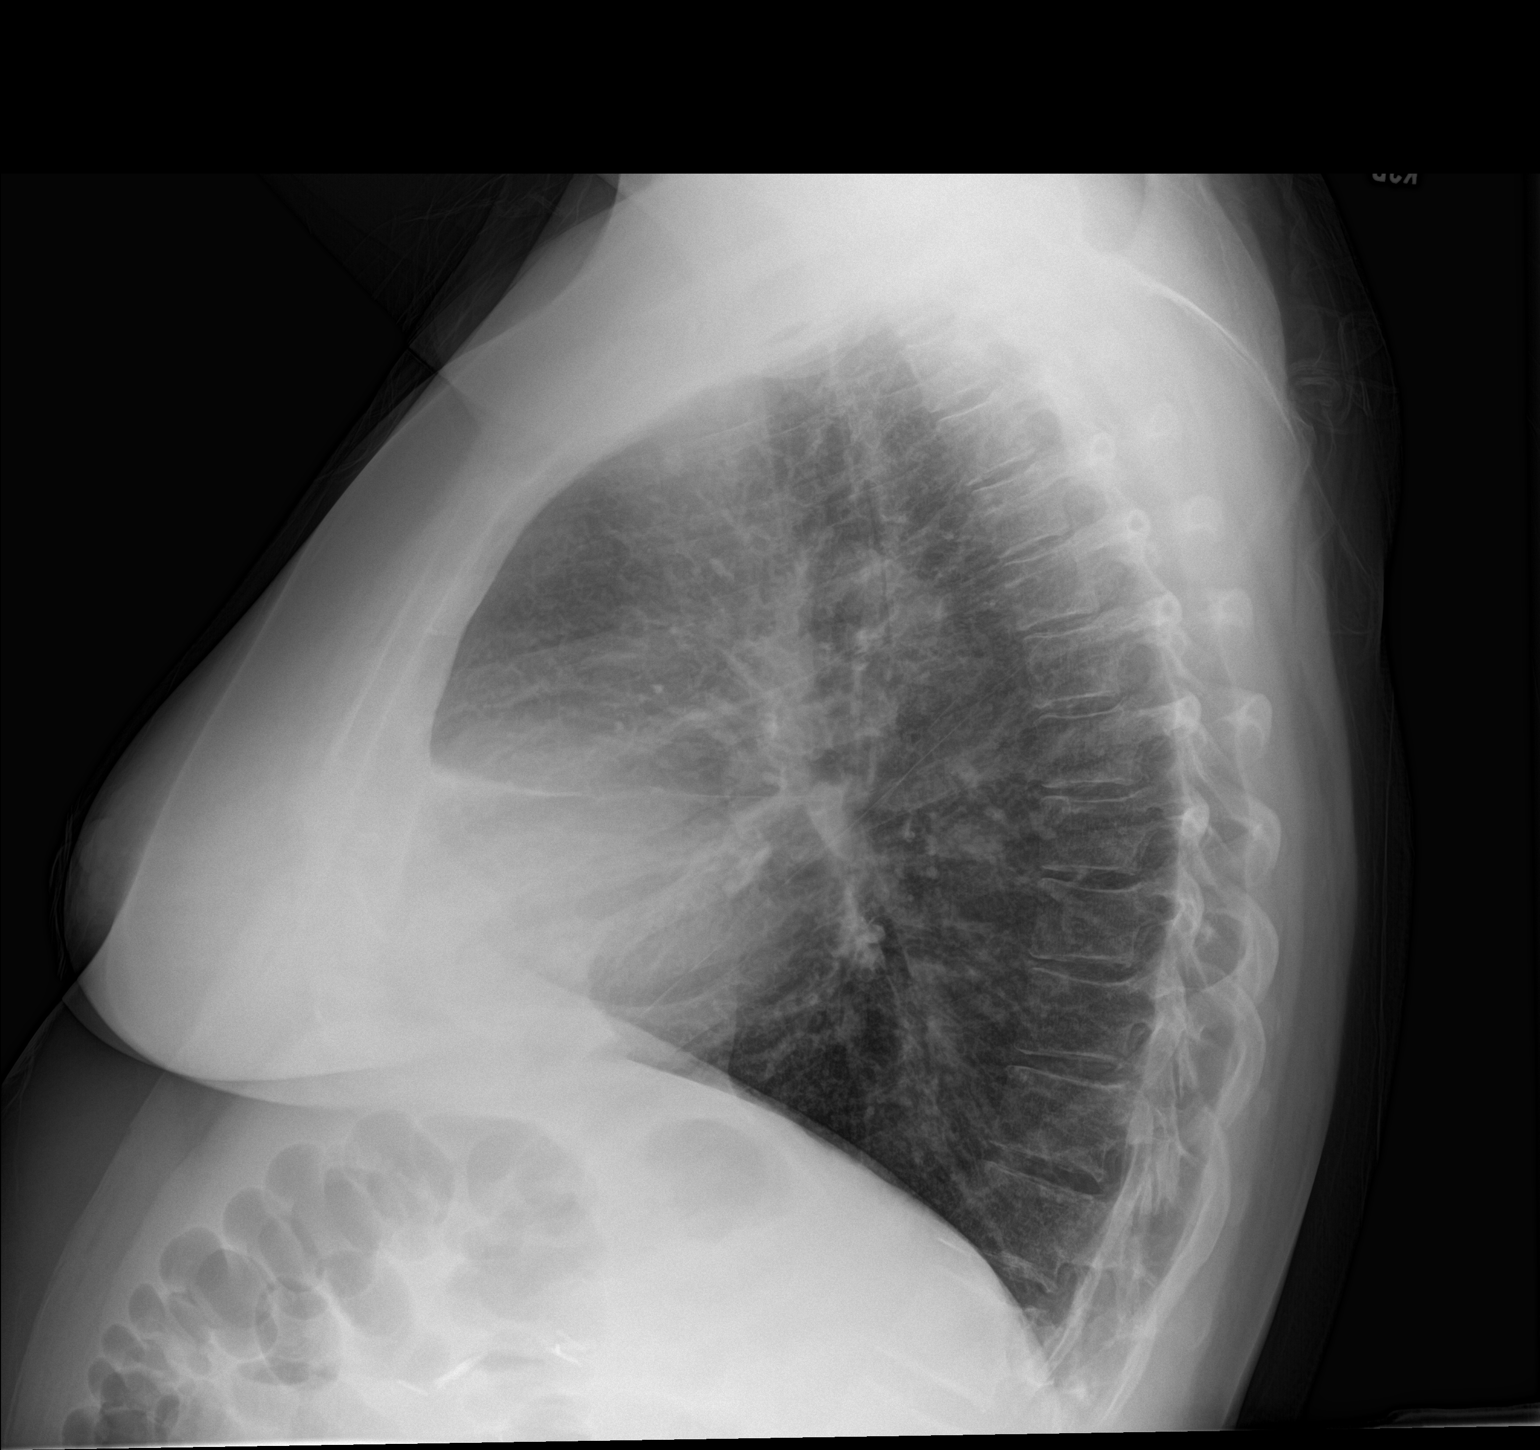

[2 of 2 positions shown; findings below may reference images not displayed]

FINDINGS: The heart size and mediastinal contours are within normal limits.
Both lungs are clear. The visualized skeletal structures are
unremarkable.
IMPRESSION: No active cardiopulmonary disease.

## 2017-07-13 ENCOUNTER — Emergency Department (HOSPITAL_COMMUNITY)
Admission: EM | Admit: 2017-07-13 | Discharge: 2017-07-13 | Disposition: A | Discharge status: Home / Self Care | Payer: Self-pay | Attending: Emergency Medicine | Admitting: Emergency Medicine

## 2017-07-13 ENCOUNTER — Other Ambulatory Visit: Payer: Self-pay

## 2017-07-13 DIAGNOSIS — J45909 Unspecified asthma, uncomplicated: Secondary | ICD-10-CM | POA: Insufficient documentation

## 2017-07-13 DIAGNOSIS — L02412 Cutaneous abscess of left axilla: Secondary | ICD-10-CM | POA: Insufficient documentation

## 2017-07-13 DIAGNOSIS — F17210 Nicotine dependence, cigarettes, uncomplicated: Secondary | ICD-10-CM | POA: Insufficient documentation

## 2017-07-13 MED ORDER — IBUPROFEN 600 MG PO TABS: 600.0000 mg | ORAL_TABLET | Freq: Four times a day (QID) | ORAL | 0 refills | Status: AC | PRN

## 2017-07-13 MED ORDER — SULFAMETHOXAZOLE-TRIMETHOPRIM 800-160 MG PO TABS: 1.0000 | ORAL_TABLET | Freq: Two times a day (BID) | ORAL | 0 refills | Status: AC

## 2017-07-13 NOTE — ED Provider Notes (Signed)
[facility] EMERGENCY DEPARTMENT Provider Note   CSN: [id number] Arrival date & time: 07/13/17  2017     History   Chief Complaint Chief Complaint  Patient presents with  . Abscess    HPI [name] is a 47 y.o. female.  The history is provided by the patient.  Abscess  Abscess location: Left axilla. Abscess quality: painful, redness and warmth   Abscess quality: not draining, no fluctuance, no induration, no itching and not weeping   Red streaking: no   Duration:  3 days Progression:  Worsening Pain details:    Quality:  Aching   Severity:  Moderate   Duration:  3 days   Timing:  Constant   Progression:  Worsening Chronicity:  New Context: not diabetes, not immunosuppression and not injected drug use   Relieved by:  Nothing Associated symptoms: no fatigue, no fever, no headaches, no nausea and no vomiting   Risk factors: no hx of MRSA     Past Medical History:  Diagnosis Date  . Abscess   . Asthma     There are no active problems to display for this patient.   Past Surgical History:  Procedure Laterality Date  . APPENDECTOMY    . CHOLECYSTECTOMY    . TONSILLECTOMY       OB History   None      Home Medications    Prior to Admission medications   Medication Sig Start Date End Date Taking? Authorizing Provider  acetaminophen (TYLENOL) 500 MG tablet Take 1 tablet (500 mg total) by mouth every 6 (six) hours as needed. 05/16/15   [name], PA-C  albuterol (PROVENTIL HFA;VENTOLIN HFA) 108 (90 BASE) MCG/ACT inhaler Inhale 2 puffs into the lungs every 6 (six) hours as needed for wheezing or shortness of breath. 02/08/14   [name], MD  cyclobenzaprine (FLEXERIL) 10 MG tablet Take 0.5-1 tablets (5-10 mg total) by mouth 2 (two) times daily as needed for muscle spasms. 08/18/16   Cathie Hoops, Amy V, PA-C  diphenhydramine-acetaminophen (TYLENOL PM) 25-500 MG TABS Take 1 tablet by mouth at bedtime as needed (sleep/pain).    [provider]  ibuprofen (ADVIL,MOTRIN) 600 MG tablet Take 1 tablet (600 mg total) by mouth every 6 (six) hours as needed for up to 7 days. 07/13/17 07/20/17  Harlene Salts A, PA-C  meloxicam (MOBIC) 15 MG tablet Take 1 tablet (15 mg total) by mouth daily. 08/20/16   Hayden Rasmussen, NP  Multiple Vitamin (MULTIVITAMIN WITH MINERALS) TABS tablet Take 1 tablet by mouth daily.    [provider]  omeprazole (PRILOSEC OTC) 20 MG tablet Take 20 mg by mouth daily as needed (heartburn).    [provider]  sulfamethoxazole-trimethoprim (BACTRIM DS,SEPTRA DS) 800-160 MG tablet Take 1 tablet by mouth 2 (two) times daily for 7 days. 07/13/17 07/20/17  Bill Salinas, PA-C  promethazine (PHENERGAN) 25 MG tablet Take 1 tablet (25 mg total) by mouth every 6 (six) hours as needed for nausea or vomiting. 09/17/13 02/08/14  Muthersbaugh, Dahlia Client, PA-C    Family History No family history on file.  Social History Social History   Tobacco Use  . Smoking status: Current Every Day Smoker  . Smokeless tobacco: Never Used  Substance Use Topics  . Alcohol use: No  . Drug use: No     Allergies   Codeine   Review of Systems Review of Systems  Constitutional: Negative.  Negative for chills, fatigue and fever.  HENT:  Negative.  Negative for congestion, ear pain, rhinorrhea, sore throat and trouble swallowing.   Eyes: Negative.  Negative for visual disturbance.  Respiratory: Negative.  Negative for cough, chest tightness and shortness of breath.   Cardiovascular: Negative.  Negative for chest pain and leg swelling.  Gastrointestinal: Negative.  Negative for abdominal pain, blood in stool, diarrhea, nausea and vomiting.  Genitourinary: Negative.  Negative for dysuria, flank pain and hematuria.  Musculoskeletal: Negative.  Negative for arthralgias, myalgias and neck pain.  Skin: Negative for rash.       Abscess of the left axilla  Neurological: Negative.  Negative for dizziness, syncope, weakness,  light-headedness and headaches.     Physical Exam Updated Vital Signs BP (!) 143/61 (BP Location: Right Arm)   Pulse 78   Temp 97.7 F (36.5 C) (Oral)   Resp 18   Ht 5\' 1"  (1.549 m)   Wt 73.5 kg (162 lb)   SpO2 98%   BMI 30.61 kg/m   Physical Exam  Constitutional: She is oriented to person, place, and time. She appears well-developed and well-nourished. No distress.  HENT:  Head: Normocephalic and atraumatic.  Eyes: Pupils are equal, round, and reactive to light.  Neck: Normal range of motion. Neck supple.  Cardiovascular: Normal rate and regular rhythm.  Pulmonary/Chest: Effort normal and breath sounds normal. No respiratory distress.  Neurological: She is alert and oriented to person, place, and time.  Skin: Skin is warm and dry.     Psychiatric: She has a normal mood and affect. Her behavior is normal.     ED Treatments / Results  Labs (all labs ordered are listed, but only abnormal results are displayed) Labs Reviewed - No data to display  EKG None  Radiology No results found.  Procedures Procedures (including critical care time)  Medications Ordered in ED Medications - No data to display   Initial Impression / Assessment and Plan / ED Course  I have reviewed the triage vital signs and the nursing notes.  Pertinent labs & imaging results that were available during my care of the patient were reviewed by me and considered in my medical decision making (see chart for details).    Patient with single skin area of swelling, unclear if area is an abscess or inflamed lymph node.  Did not perform into the incision and drainage due to possibility that it is a lymph node, also if the area was an abscess it would be too small to drain at this time. Patient informed to follow-up with primary care provider regarding her visit today.  Referral given to community health and wellness.  I have encouraged the patient to use warm compresses every hour.  Mild signs of  cellulitis is surrounding skin, no streaking.  Plan to discharge patient with a 7-day course of Bactrim as well as anti-inflammatory medication. Patient given extensive return precautions. Patient states understanding of return precautions and is agreeable with plan.   Patient is denies immunocompromising/comorbid conditions including diabetes or kidney disease, no history of endocarditis or indwelling medical devices and without signs of systemic infection.  At this time there does not appear to be any evidence of an acute emergency medical condition and the patient appears stable for discharge with appropriate outpatient follow up. Diagnosis was discussed with patient who verbalizes understanding and is agreeable to discharge. I have discussed return precautions with patient who verbalizes understanding of return precautions. Patient strongly encouraged to follow-up with their PCP.  Patient seen by Trisha MangleKaren Sophia,  PA-C who agrees with plan to discharge with Bactrim and follow-up.  Final Clinical Impressions(s) / ED Diagnoses   Final diagnoses:  Abscess of left axilla    ED Discharge Orders        Ordered    sulfamethoxazole-trimethoprim (BACTRIM DS,SEPTRA DS) 800-160 MG tablet  2 times daily     07/13/17 2226    ibuprofen (ADVIL,MOTRIN) 600 MG tablet  Every 6 hours PRN     07/13/17 2231       Elizabeth Palau 07/14/17 0127    Arby Barrette, MD 07/15/17 1454

## 2017-07-13 NOTE — ED Notes (Signed)
See EDP assessment / note.   

## 2017-07-13 NOTE — ED Notes (Signed)
Pt verbalized understanding discharge instructions and denies any further needs or questions at this time. VS stable, ambulatory and steady gait.   

## 2017-07-13 NOTE — ED Triage Notes (Signed)
Patient c/o abscess to left axilla.

## 2017-07-13 NOTE — Discharge Instructions (Addendum)
1.  Take all of your antibiotic as prescribed. 2. You may use ibuprofen, over-the-counter anti-inflammatory medications as needed for pain. 3. Use warm compresses on the area for 20 minutes every hour while awake. 4. Call [place] [facility] to establish primary care provider. 5. Return to the emergency room for any new or worsening symptoms.  Contact a doctor if: You have more redness, swelling, or pain around your abscess. You have more fluid or blood coming from your abscess. Your abscess feels warm when you touch it. You have more pus or a bad smell coming from your abscess. You have a fever. Your muscles ache. You have chills. You feel sick. Get help right away if: You have very bad (severe) pain. You see red streaks on your skin spreading away from the abscess.

## 2017-07-23 ENCOUNTER — Encounter (HOSPITAL_COMMUNITY): Payer: Self-pay | Admitting: Emergency Medicine

## 2017-07-23 ENCOUNTER — Other Ambulatory Visit: Payer: Self-pay

## 2017-07-23 ENCOUNTER — Emergency Department (HOSPITAL_COMMUNITY)
Admission: EM | Admit: 2017-07-23 | Discharge: 2017-07-23 | Disposition: A | Discharge status: Home / Self Care | Payer: Medicaid Other | Attending: Emergency Medicine | Admitting: Emergency Medicine

## 2017-07-23 DIAGNOSIS — R251 Tremor, unspecified: Secondary | ICD-10-CM | POA: Insufficient documentation

## 2017-07-23 DIAGNOSIS — R110 Nausea: Secondary | ICD-10-CM | POA: Insufficient documentation

## 2017-07-23 DIAGNOSIS — R102 Pelvic and perineal pain: Secondary | ICD-10-CM | POA: Insufficient documentation

## 2017-07-23 DIAGNOSIS — Z5321 Procedure and treatment not carried out due to patient leaving prior to being seen by health care provider: Secondary | ICD-10-CM | POA: Insufficient documentation

## 2017-07-23 DIAGNOSIS — R232 Flushing: Secondary | ICD-10-CM | POA: Insufficient documentation

## 2017-07-23 MED ORDER — ONDANSETRON 4 MG PO TBDP
4.0000 mg | ORAL_TABLET | Freq: Once | ORAL | Status: AC
  Administered 2017-07-23: 4 mg via ORAL
  Filled 2017-07-23: qty 1
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
## 2017-07-23 NOTE — ED Triage Notes (Signed)
Pt took misoprostol as prescribed last night and states she immediately began shaking, pelvic cramping, nausea, flush feeling.  Pt states she is going to have an IUD placed in the morning and she was suppose to take as preparation.

## 2017-07-23 NOTE — ED Notes (Signed)
Pt called for a room for a second time without an answer

## 2018-03-01 ENCOUNTER — Encounter: Payer: Self-pay | Admitting: *Deleted

## 2018-04-12 ENCOUNTER — Encounter: Payer: Self-pay | Admitting: Student

## 2018-04-12 ENCOUNTER — Telehealth: Payer: Self-pay | Admitting: Obstetrics and Gynecology

## 2018-04-12 ENCOUNTER — Encounter: Payer: Medicaid Other | Admitting: Obstetrics and Gynecology

## 2018-04-12 NOTE — Telephone Encounter (Signed)
Called the patient to inform of the rescheduling of her appointment due to the COVID19. Left a detailed message of rescheduling in May.

## 2018-04-27 ENCOUNTER — Telehealth: Payer: Self-pay | Admitting: Obstetrics & Gynecology

## 2018-04-27 NOTE — Telephone Encounter (Signed)
The patient called in due to the postponed appointment. Informed the patient the visit is postponed due to the COVID19 restrictions. It is now mandated that all visits that are considered non-urgent / elective be rescheduled after May. However if she has any concerns we can defiantly have a nurse call to speak with you regarding any concerns.

## 2018-04-27 NOTE — Telephone Encounter (Signed)
The patient also stated she would like to be scheduled as soon as possible because has gential warts in her vagina area. Stated the cream she has gives her a rash. She doesn't not want to try any other medications as now and she is also not interested in speaking with a nurse. She stated she will wait until her appointment is scheduled to make any changes.

## 2018-07-29 ENCOUNTER — Encounter: Payer: Medicaid Other | Admitting: Obstetrics & Gynecology

## 2018-07-29 ENCOUNTER — Telehealth: Payer: Self-pay | Admitting: Obstetrics & Gynecology

## 2018-07-29 ENCOUNTER — Encounter: Payer: Self-pay | Admitting: Obstetrics & Gynecology

## 2018-07-29 NOTE — Telephone Encounter (Signed)
The patient called in to reschedule the appointment. Stated she would rather have a female. Verbalized understanding with the date and time.

## 2018-08-12 ENCOUNTER — Encounter

## 2018-09-08 ENCOUNTER — Other Ambulatory Visit (HOSPITAL_COMMUNITY)
Admission: RE | Admit: 2018-09-08 | Discharge: 2018-09-08 | Disposition: A | Discharge status: Home / Self Care | Payer: Medicaid Other | Source: Ambulatory Visit | Attending: Family Medicine | Admitting: Family Medicine

## 2018-09-08 ENCOUNTER — Telehealth: Payer: Self-pay | Admitting: *Deleted

## 2018-09-08 ENCOUNTER — Other Ambulatory Visit: Payer: Self-pay

## 2018-09-08 ENCOUNTER — Encounter: Payer: Self-pay | Admitting: Family Medicine

## 2018-09-08 ENCOUNTER — Ambulatory Visit (INDEPENDENT_AMBULATORY_CARE_PROVIDER_SITE_OTHER): Payer: Self-pay | Admitting: Family Medicine

## 2018-09-08 VITALS — BP 133/76 | HR 72 | Wt 166.2 lb

## 2018-09-08 DIAGNOSIS — F17200 Nicotine dependence, unspecified, uncomplicated: Secondary | ICD-10-CM | POA: Insufficient documentation

## 2018-09-08 DIAGNOSIS — A630 Anogenital (venereal) warts: Secondary | ICD-10-CM | POA: Insufficient documentation

## 2018-09-08 DIAGNOSIS — J45909 Unspecified asthma, uncomplicated: Secondary | ICD-10-CM | POA: Insufficient documentation

## 2018-09-08 MED ORDER — IMIQUIMOD 5 % EX CREA: TOPICAL_CREAM | CUTANEOUS | 5 refills | Status: AC

## 2018-09-08 MED ORDER — MULTIVITAMINS PO CAPS: 1.0000 | ORAL_CAPSULE | Freq: Every day | ORAL | 2 refills | Status: AC

## 2018-09-08 NOTE — Telephone Encounter (Addendum)
Discussed with Dr. Kennon Rounds and instructed to see if patient can get rx filled at health department and /or thru [facility].  I called [place] and they state she would have to be seen at the [facility] and presribed the medication by a physician there. I called patient and she has been a patient at health department before and has an appointment tomorrow. She states she will keep the appointment there. She was frustrated because they sent her to [place] but it was held up due to [place]. I expressed support. She voices her thanks for listening.  Linda,RN

## 2018-09-08 NOTE — Telephone Encounter (Signed)
[name] left a voicemail message this afternoon that she just saw Dr. Kennon Rounds and she went to [facility] to get her medicine and it is too expensive . Wants to know if we can figure out something else.  [name],RN

## 2018-09-08 NOTE — Assessment & Plan Note (Signed)
Resume [name], no TCA in office. S/p vulvar biopsy. Apply vaseline or A and D to protect normal skin.

## 2018-09-08 NOTE — Patient Instructions (Signed)
Genital Warts Genital warts are a common STD (sexually transmitted disease). They may appear as small bumps on the skin of the genital and anal areas. They sometimes become irritated and cause pain. Genital warts are easily passed to other people through sexual contact. Many people do not know that they are infected. They may be infected for years without symptoms. However, even if they do not have symptoms, they can pass the infection to their sexual partners. Getting treatment is important because genital warts can lead to other problems. In females, the virus that causes genital warts may increase the risk for cervical cancer. What are the causes? This condition is caused by a virus that is called human papillomavirus (HPV). HPV is spread by having unprotected sex with an infected person. It can be spread through vaginal, anal, and oral sex. What increases the risk? You are more likely to develop this condition if:  You have unprotected sex.  You have multiple sexual partners.  You are sexually active before age 16.  You are a man who isnot circumcised.  You have a female sexual partner who is not circumcised.  You have a weakened body defense system (immune system) due to disease or medicine.  You smoke. What are the signs or symptoms? Symptoms of this condition include:  Small growths in the genital area or anal area. These warts often grow in clusters.  Itching and irritation in the genital area or anal area.  Bleeding from the warts.  Pain during sex. How is this diagnosed? This condition is diagnosed based on:  Your symptoms.  A physical exam. You may also have other tests, including:  Biopsy. A tissue sample is removed so it can be checked under a microscope.  Colposcopy. In females, a magnifying tool is used to examine the vagina and cervix. Certain solutions may be used to make the HPV cells change color so they can be seen more easily.  A Pap test in females.   Tests for other STDs. How is this treated? This condition may be treated with:  Medicines, such as: ? Solutions or creams applied to your skin (topical).  Procedures, such as: ? Freezing the warts with liquid nitrogen (cryotherapy). ? Burning the warts with a laser or electric probe (electrocautery). ? Surgery to remove the warts. Follow these instructions at home: Medicines   Apply over-the-counter and prescription medicines only as told by your health care provider.  Do not treat genital warts with medicines that are used for treating hand warts.  Talk with your health care provider about using over-the-counter anti-itch creams. Instructions for women  Get screened regularly for cervical cancer. Women who have genital warts are at an increased risk for this cancer. This type of cancer is slow growing and can be cured if it is found early.  If you become pregnant, tell your health care provider that you have had an HPV infection. Your health care provider will monitor you closely during pregnancy to be sure that your baby is safe. General instructions  Do not touch or scratch the warts.  Do not have sex until your treatment has been completed.  Tell your current and past sexual partners about your condition because they may also need treatment.  After treatment, use condoms during sex to prevent future infections.  Keep all follow-up visits as told by your health care provider. This is important. How is this prevented? Talk with your health care provider about getting the HPV vaccine. The vaccine:    Can prevent some HPV infections and cancers.  Is recommended for males and females who are 11-26 years old.  Is not recommended for pregnant women.  Will not work if you already have HPV. Contact a health care provider if you:  Have redness, swelling, or pain in the area of the treated skin.  Have a fever.  Feel generally ill.  Feel lumps in and around your genital or  anal area.  Have bleeding in your genital or anal area.  Have pain during sex. Summary  Genital warts are a common STD (sexually transmitted disease). It may appear as small bumps on the genital and anal areas.  This condition is caused by a virus that is called human papillomavirus (HPV). HPV is spread by having unprotected sex with an infected person. It can be spread through vaginal, anal, and oral sex.  Treatment is important because genital warts can lead to other problems. In females, the virus that causes genital warts may increase the risk for cervical cancer.  This condition may be treated with medicine that is applied to the skin, or procedures to remove the warts.  The HPV vaccine can prevent some HPV infections and cancers. It is recommended that the vaccine be given to males and females who are 11-26 years old. This information is not intended to replace advice given to you by your health care provider. Make sure you discuss any questions you have with your health care provider. Document Released: 12/28/1999 Document Revised: 02/03/2017 Document Reviewed: 02/03/2017 Elsevier Patient Education  2020 [employer]  

## 2018-09-08 NOTE — Progress Notes (Signed)
   Subjective:    Patient ID: [name] is a 48 y.o. female presenting with Follow-up  on 09/08/2018  HPI: States she was told she had HPV. Used Aldara but it started irritating her skin, and then had to stop. Has noted this for the past 1 year.   Review of Systems  Constitutional: Negative for chills and fever.  Respiratory: Negative for shortness of breath.   Cardiovascular: Negative for chest pain.  Gastrointestinal: Negative for abdominal pain, nausea and vomiting.  Genitourinary: Negative for dysuria.  Skin: Negative for rash.      Objective:    BP 133/76   Pulse 72   Wt 166 lb 3.2 oz (75.4 kg)   BMI 31.40 kg/m  Physical Exam Constitutional:      General: She is not in acute distress.    Appearance: She is well-developed.  HENT:     Head: Normocephalic and atraumatic.  Eyes:     General: No scleral icterus. Neck:     Musculoskeletal: Neck supple.  Cardiovascular:     Rate and Rhythm: Normal rate.  Pulmonary:     Effort: Pulmonary effort is normal.  Abdominal:     Palpations: Abdomen is soft.  Genitourinary:    Comments: Multiple condyloma seen, 2 large flat ones on left labia majora, one large one at clitoral hood-will biopsy to ensure just condyloma. Kissing lesions at rectum. Skin:    General: Skin is warm and dry.  Neurological:     Mental Status: She is alert and oriented to person, place, and time.         Assessment & Plan:   Problem List Items Addressed This Visit      Unprioritized   Genital warts - Primary    Resume Aldara, no TCA in office. S/p vulvar biopsy. Apply vaseline or A and D to protect normal skin.      Relevant Medications   imiquimod (ALDARA) 5 % cream   Multiple Vitamin (MULTIVITAMIN) capsule   Other Relevant Orders   Surgical pathology( [facility]/ [facility])      Total face-to-face time with patient: 20 minutes. Over 50% of encounter was spent on counseling and coordination of care. Return in about 4 weeks  (around 10/06/2018) for a follow-up.  Donnamae Jude 09/08/2018 4:02 PM

## 2018-09-09 MED FILL — IMIQUIMOD 5% CREAM PACKET: 5 | 30 days supply | Qty: 12 | Fill #0

## 2018-09-15 ENCOUNTER — Telehealth (INDEPENDENT_AMBULATORY_CARE_PROVIDER_SITE_OTHER): Payer: Self-pay | Admitting: Lactation Services

## 2018-09-15 DIAGNOSIS — L821 Other seborrheic keratosis: Secondary | ICD-10-CM

## 2018-09-15 NOTE — Telephone Encounter (Signed)
Called and spoke with pt to given her her biopsy results. Pt pleased with results. She was informed to continue Aldera in her Rectal Area. Pt to call with any questions/concerns as needed.

## 2018-09-15 NOTE — Telephone Encounter (Signed)
-----   Message from Donnamae Jude, MD sent at 09/13/2018  4:19 PM EDT ----- Her biopsy shows seborrheic keratosis, or sun damage. No pre-cancer lesion. Continue Aldara in the rectal areas.

## 2018-10-20 ENCOUNTER — Ambulatory Visit (INDEPENDENT_AMBULATORY_CARE_PROVIDER_SITE_OTHER): Payer: Self-pay | Admitting: Family Medicine

## 2018-10-20 ENCOUNTER — Encounter: Payer: Self-pay | Admitting: Family Medicine

## 2018-10-20 ENCOUNTER — Other Ambulatory Visit: Payer: Self-pay

## 2018-10-20 DIAGNOSIS — A630 Anogenital (venereal) warts: Secondary | ICD-10-CM

## 2018-10-20 DIAGNOSIS — F17200 Nicotine dependence, unspecified, uncomplicated: Secondary | ICD-10-CM

## 2018-10-20 NOTE — Progress Notes (Signed)
   Subjective:    Patient ID: [name] is a 48 y.o. female presenting with No chief complaint on file.  on 10/20/2018  HPI: Here today for f/u of genital warts. Did not start Aldara. Sent here from [facility] and we have no TCA (on backorder). Vulvar biopsy showed seborrheic keratosis only.  Review of Systems  Constitutional: Negative for chills and fever.  Respiratory: Negative for shortness of breath.   Cardiovascular: Negative for chest pain.  Gastrointestinal: Negative for abdominal pain, nausea and vomiting.  Genitourinary: Negative for dysuria.  Skin: Negative for rash.      Objective:    BP (!) 184/93   Pulse 90   Wt 172 lb (78 kg)   BMI 32.50 kg/m  Physical Exam Constitutional:      General: She is not in acute distress.    Appearance: She is well-developed.  HENT:     Head: Normocephalic and atraumatic.  Eyes:     General: No scleral icterus. Neck:     Musculoskeletal: Neck supple.  Cardiovascular:     Rate and Rhythm: Normal rate.  Pulmonary:     Effort: Pulmonary effort is normal.  Abdominal:     Palpations: Abdomen is soft.  Skin:    General: Skin is warm and dry.  Neurological:     Mental Status: She is alert and oriented to person, place, and time.         Assessment & Plan:   Problem List Items Addressed This Visit      Unprioritized   Smoker    Quitting smoking       Genital warts    Begin Aldara, coat unaffected areas with Vaseline. Use 3x/wk, wash off q am. If we get TCA, may return here for treatment. May try freezing if able, but do not use together.        Pap at [facility] Total face-to-face time with patient: 10 minutes. Over 50% of encounter was spent on counseling and coordination of care. Return in 3 months (on 01/20/2019), or if symptoms worsen or fail to improve.  Donnamae Jude 10/21/2018 7:45 PM

## 2018-10-21 ENCOUNTER — Encounter: Payer: Self-pay | Admitting: Family Medicine

## 2018-10-21 NOTE — Assessment & Plan Note (Signed)
Quitting smoking

## 2018-10-21 NOTE — Assessment & Plan Note (Signed)
Begin Aldara, coat unaffected areas with Vaseline. Use 3x/wk, wash off q am. If we get TCA, may return here for treatment. May try freezing if able, but do not use together.

## 2018-11-01 MED FILL — IMIQUIMOD 5 % CREA: 5 | 30 days supply | Qty: 12 | Fill #1

## 2018-11-22 MED FILL — IMIQUIMOD 5 % CREA: 5 | 30 days supply | Qty: 12 | Fill #1

## 2023-10-03 DIAGNOSIS — R3989 Other symptoms and signs involving the genitourinary system: Secondary | ICD-10-CM | POA: Diagnosis not present

## 2023-10-03 NOTE — Progress Notes (Addendum)
 [facility] Dem[facility] Patient Information  Location Information: Patient State (at time of visit): [place]  Patient Location (at time of visit):Home/Other Non-Medical  Provider Location: Home Is provider licensed to provide clinical care in the current location/state of the patient? Yes  Consent  Patient's identity was confirmed. Presenting condition or illness was discussed with the patient/personal representative. Current proposed treatment for presenting condition or illness was explained to patient/personal representative along with the likely benefits and any significant risks or complications associated with the provision of treatment by audio/video means. The patient/personal representative verbally authorized treatment to be provided by audio/video, which may include a limited review of patient's current health status, medication, or other treatment recommendations, patient education, and an opportunity to ask questions about condition and treatment. Verbal Consent Granted by Patient/Personal Representative:Yes   Visit Information: Modality: 2-Way Real-Time Audio/Video  Video Visit Start Time/ End Time:  Start time: 10/03/2023  1:37 PM EDT End time: 10/03/2023  1:43 PM EDT  Video Total Time: 7 minutes  History of Present Illness  [name] presents due to concerns for possible UTI.  She complains of urinary frequency, urinary urgency, malodorous urine and dysuria for the past 1 week.  Denies hematuria, abdominal pain, flank pain, pelvic pain, fever, chills, vomiting, nausea, or vaginal discharge.  She has increased fluids without improvement in symptoms.  No recent treatment for UTI.  Postmenopausal.  No concern for STI.  No history of kidney stones.     Video Exam  Physical Exam Constitutional:      Appearance: Normal appearance.  HENT:     Right Ear: External ear normal.     Left Ear: External ear normal.     Nose: Nose normal.  Pulmonary:     Effort:  Pulmonary effort is normal. No respiratory distress.     Breath sounds: No stridor.     Comments: Able to speak in fluid sentences with no gasping.  No audible wheeze Abdominal:     Tenderness: There is no abdominal tenderness (self palpation). There is no right CVA tenderness (self palpation) or left CVA tenderness (self palpation).  Musculoskeletal:     Cervical back: Neck supple. No rigidity.  Skin:    Coloration: Skin is not jaundiced.     Findings: No erythema.  Neurological:     General: No focal deficit present.     Mental Status: She is alert.  Psychiatric:        Mood and Affect: Mood normal.        Behavior: Behavior normal.        Thought Content: Thought content normal.     Diagnosis, Medical Decision Making & Disposition  Assessment/ Plan 1. Suspected UTI (Primary) - nitrofurantoin, macrocrystal-monohydrate, (MACROBID) 100 mg capsule; Take 1 capsule (100 mg total) by mouth 2 (two) times a day for 5 days.  Dispense: 10 capsule; Refill: 0 - phenazopyridine (PYRIDIUM) 200 mg tablet; Take 1 tablet (200 mg total) by mouth 3 (three) times a day with meals as needed for bladder spasms (take after meals).  Dispense: 6 tablet; Refill: 0   Discussed with patient that symptoms are consistent with UTI. She is to begin nitrofurantoin. Other suggestions for care at home:    - Increase your fluid intake- drink plenty of water.   - Urinate more often.  Don't try to hold urine in for a long time.   - If you were given Pyridium (phenazopyridine) to reduce frequency, pain or burning with urination, it will  cause your urine to become orange.  This can stain clothing.   - You can use Tylenol  or Advil  as needed for pain or discomfort.   - Avoid intercourse until your symptoms are gone.   Monitor your symptoms, which we expect will improve in the next 24-48 hours.   Please seek prompt attention for worsening symptoms: fever 100.4 or higher, back or belly pain, repeated vomiting, vaginal  discharge or any pain, no improvement in 48 hours or worsening over 24 hours of antibiotic treatment, redness or swelling of the outer vaginal area (labia).   For questions or concerns regarding this visit, patient should contact Virtual Support at [phone].  Disposition:  Patient to continue care at home.  Electronically signed: Rollo Sonda Punch, PA-C 10/03/2023  1:43 PM

## 2023-10-11 ENCOUNTER — Encounter: Payer: Self-pay | Admitting: Emergency Medicine

## 2023-10-11 ENCOUNTER — Ambulatory Visit
Admission: EM | Admit: 2023-10-11 | Discharge: 2023-10-11 | Disposition: A | Discharge status: Home / Self Care | Attending: Emergency Medicine | Admitting: Emergency Medicine

## 2023-10-11 DIAGNOSIS — R300 Dysuria: Secondary | ICD-10-CM | POA: Diagnosis not present

## 2023-10-11 LAB — POCT URINE DIPSTICK
Bilirubin, UA: NEGATIVE
Glucose, UA: NEGATIVE mg/dL
Ketones, POC UA: NEGATIVE mg/dL
Leukocytes, UA: NEGATIVE
Nitrite, UA: NEGATIVE
POC PROTEIN,UA: NEGATIVE
Spec Grav, UA: 1.015 (ref 1.010–1.025)
Urobilinogen, UA: 0.2 U/dL
pH, UA: 6.5 (ref 5.0–8.0)

## 2023-10-11 MED ORDER — FLUCONAZOLE 150 MG PO TABS: 150.0000 mg | ORAL_TABLET | Freq: Every day | ORAL | 0 refills | Status: AC

## 2023-10-11 MED ORDER — PHENAZOPYRIDINE HCL 200 MG PO TABS: 200.0000 mg | ORAL_TABLET | Freq: Three times a day (TID) | ORAL | 0 refills | Status: AC

## 2023-10-11 MED ORDER — CIPROFLOXACIN HCL 500 MG PO TABS: 500.0000 mg | ORAL_TABLET | Freq: Two times a day (BID) | ORAL | 0 refills | Status: AC

## 2023-10-11 NOTE — ED Triage Notes (Signed)
 Patient reports urinary frequency, painful urination and blood in urine x 2 weeks. Patient had an E-visit and was place on Macrobid and Pyridium. Patient states symptoms has not improve.

## 2023-10-11 NOTE — ED Provider Notes (Signed)
 [facility]    CSN: [id number] Arrival date & time: 10/11/23  1253      History   Chief Complaint Chief Complaint  Patient presents with   Urinary Frequency   Hematuria    HPI [name] is a 53 y.o. female.   Patient presents for evaluation of urinary frequency, dysuria, hematuria, incomplete bladder emptying, mild lower abdominal pressure and a foul urinary odor present for 2 weeks.  Completed telehealth visit and was prescribed Macrobid and Pyridium and while helpful symptoms did not fully resolved.  Denies fever or flank pain.  Past Medical History:  Diagnosis Date   Abscess    Asthma     Patient Active Problem List   Diagnosis Date Noted   Smoker 09/08/2018   Asthma 09/08/2018   Genital warts 09/08/2018    Past Surgical History:  Procedure Laterality Date   APPENDECTOMY     CHOLECYSTECTOMY     TONSILLECTOMY      OB History     Gravida  2   Para  1   Term  1   Preterm      AB  1   Living  1      SAB  1   IAB      Ectopic      Multiple      Live Births  1            Home Medications    Prior to Admission medications   Medication Sig Start Date End Date Taking? Authorizing Provider  ciprofloxacin (CIPRO) 500 MG tablet Take 1 tablet (500 mg total) by mouth 2 (two) times daily for 5 days. 10/11/23 10/16/23 Yes Nadra Hritz R, NP  fluconazole (DIFLUCAN) 150 MG tablet Take 1 tablet (150 mg total) by mouth daily for 2 doses. 10/11/23 10/13/23 Yes Wojciech Willetts, Shelba JONELLE, NP  phenazopyridine (PYRIDIUM) 200 MG tablet Take 1 tablet (200 mg total) by mouth 3 (three) times daily. 10/11/23  Yes Bernarr Longsworth R, NP  acetaminophen  (TYLENOL ) 500 MG tablet Take 1 tablet (500 mg total) by mouth every 6 (six) hours as needed. 05/16/15   Veronica Almarie BROCKS, PA-C  albuterol  (PROVENTIL  HFA;VENTOLIN  HFA) 108 (90 BASE) MCG/ACT inhaler Inhale 2 puffs into the lungs every 6 (six) hours as needed for wheezing or shortness of breath. 02/08/14   Joane Artist RAMAN, MD  cyclobenzaprine  (FLEXERIL ) 10 MG tablet Take 0.5-1 tablets (5-10 mg total) by mouth 2 (two) times daily as needed for muscle spasms. 08/18/16   Babara, Amy V, PA-C  diphenhydramine-acetaminophen  (TYLENOL  PM) 25-500 MG TABS Take 1 tablet by mouth at bedtime as needed (sleep/pain).    [provider]  imiquimod  (ALDARA ) 5 % cream Apply topically 3 (three) times a week. 09/08/18   Fredirick Glenys RAMAN, MD  meloxicam  (MOBIC ) 15 MG tablet Take 1 tablet (15 mg total) by mouth daily. 08/20/16   Tharon Lenis, NP  Multiple Vitamin (MULTIVITAMIN WITH MINERALS) TABS tablet Take 1 tablet by mouth daily.    [provider]  Multiple Vitamin (MULTIVITAMIN) capsule Take 1 capsule by mouth daily. 09/08/18   Fredirick Glenys RAMAN, MD  omeprazole (PRILOSEC OTC) 20 MG tablet Take 20 mg by mouth daily as needed (heartburn).    [provider]    Family History Family History  Problem Relation Age of Onset   Hypertension Paternal Grandmother    Stroke Paternal Grandmother    Diabetes Maternal Grandmother    Heart disease Maternal Uncle  Social History Social History   Tobacco Use   Smoking status: Every Day   Smokeless tobacco: Never  Substance Use Topics   Alcohol use: No   Drug use: No     Allergies   Codeine   Review of Systems Review of Systems   Physical Exam Triage Vital Signs ED Triage Vitals  Encounter Vitals Group     BP 10/11/23 1314 (!) 150/74     Girls Systolic BP Percentile --      Girls Diastolic BP Percentile --      Boys Systolic BP Percentile --      Boys Diastolic BP Percentile --      Pulse Rate 10/11/23 1314 60     Resp 10/11/23 1314 18     Temp 10/11/23 1314 98 F (36.7 C)     Temp Source 10/11/23 1314 Oral     SpO2 10/11/23 1314 98 %     Weight --      Height --      Head Circumference --      Peak Flow --      Pain Score 10/11/23 1311 0     Pain Loc --      Pain Education --      Exclude from Growth Chart --    No data  found.  Updated Vital Signs BP (!) 150/74 (BP Location: Left Arm)   Pulse 60   Temp 98 F (36.7 C) (Oral)   Resp 18   SpO2 98%   Visual Acuity Right Eye Distance:   Left Eye Distance:   Bilateral Distance:    Right Eye Near:   Left Eye Near:    Bilateral Near:     Physical Exam Constitutional:      Appearance: Normal appearance.  Eyes:     Extraocular Movements: Extraocular movements intact.  Pulmonary:     Effort: Pulmonary effort is normal.  Abdominal:     Tenderness: There is abdominal tenderness in the suprapubic area. There is no right CVA tenderness, left CVA tenderness or guarding.  Neurological:     Mental Status: She is alert and oriented to person, place, and time. Mental status is at baseline.      UC Treatments / Results  Labs (all labs ordered are listed, but only abnormal results are displayed) Labs Reviewed  POCT URINE DIPSTICK - Abnormal; Notable for the following components:      Result Value   Color, UA colorless (*)    Blood, UA small (*)    All other components within normal limits  URINE CULTURE    EKG   Radiology No results found.  Procedures Procedures (including critical care time)  Medications Ordered in UC Medications - No data to display  Initial Impression / Assessment and Plan / UC Course  I have reviewed the triage vital signs and the nursing notes.  Pertinent labs & imaging results that were available during my care of the patient were reviewed by me and considered in my medical decision making (see chart for details).  Dysuria  Urinalysis negative, sent for culture, empirically placed on ciprofloxacin as she is symptomatic, additionally prescribed Pyridium and Diflucan due to recent antibiotic use as endorses a history of yeast infections with antibiotics, recommended over-the-counter medications and nonpharmacological supportive care, may follow-up with urgent care as Final Clinical Impressions(s) / UC Diagnoses    Final diagnoses:  Dysuria     Discharge Instructions      Your urinalysis did  not show infection, your urine will be sent to the lab to determine exactly which bacteria is present, if any changes need to be made to your medications you will be notified  Begin use of ciprofloxacin twice daily for 5 days  You may use Pyridium every 8 hours as needed to help with discomfort  If you begin to have vaginal itching or discharge take 1 Diflucan tablet then after completion of all medication take second dose  Increase your fluid intake through use of water  As always practice good hygiene, wiping front to back and avoidance of scented vaginal products to prevent further irritation  If symptoms continue to persist after use of medication or recur please follow-up with urgent care or your primary doctor as needed    ED Prescriptions     Medication Sig Dispense Auth. Provider   ciprofloxacin (CIPRO) 500 MG tablet Take 1 tablet (500 mg total) by mouth 2 (two) times daily for 5 days. 10 tablet Lorrinda Ramstad R, NP   fluconazole (DIFLUCAN) 150 MG tablet Take 1 tablet (150 mg total) by mouth daily for 2 doses. 2 tablet Pervis Macintyre R, NP   phenazopyridine (PYRIDIUM) 200 MG tablet Take 1 tablet (200 mg total) by mouth 3 (three) times daily. 6 tablet Kelsy Polack R, NP      PDMP not reviewed this encounter.   Teresa Shelba SAUNDERS, NP 10/11/23 1348

## 2023-10-11 NOTE — Discharge Instructions (Addendum)
 Your urinalysis did not show infection, your urine will be sent to the lab to determine exactly which bacteria is present, if any changes need to be made to your medications you will be notified  Begin use of ciprofloxacin twice daily for 5 days  You may use Pyridium every 8 hours as needed to help with discomfort  If you begin to have vaginal itching or discharge take 1 Diflucan tablet then after completion of all medication take second dose  Increase your fluid intake through use of water  As always practice good hygiene, wiping front to back and avoidance of scented vaginal products to prevent further irritation  If symptoms continue to persist after use of medication or recur please follow-up with urgent care or your primary doctor as needed

## 2023-10-12 ENCOUNTER — Ambulatory Visit (HOSPITAL_COMMUNITY): Payer: Self-pay

## 2023-10-12 LAB — URINE CULTURE: Culture: <10000 — AB
